# Patient Record
Sex: Male | Born: 1960 | Race: Black or African American | Hispanic: No | Marital: Single | Smoking: Never smoker
Health system: Southern US, Community
[De-identification: ages and names within clinical notes are randomized; demographics above are authoritative.]

## PROBLEM LIST (undated history)

## (undated) DIAGNOSIS — K589 Irritable bowel syndrome without diarrhea: Secondary | ICD-10-CM

## (undated) DIAGNOSIS — F1020 Alcohol dependence, uncomplicated: Secondary | ICD-10-CM

## (undated) DIAGNOSIS — I10 Essential (primary) hypertension: Secondary | ICD-10-CM

## (undated) DIAGNOSIS — E7800 Pure hypercholesterolemia, unspecified: Secondary | ICD-10-CM

## (undated) DIAGNOSIS — M5020 Other cervical disc displacement, unspecified cervical region: Secondary | ICD-10-CM

## (undated) DIAGNOSIS — R1084 Generalized abdominal pain: Secondary | ICD-10-CM

---

## 2011-09-29 ENCOUNTER — Inpatient Hospital Stay (INDEPENDENT_AMBULATORY_CARE_PROVIDER_SITE_OTHER)
Admission: RE | Admit: 2011-09-29 | Discharge: 2011-09-29 | Disposition: A | Discharge status: Home / Self Care | Payer: Self-pay | Source: Ambulatory Visit | Attending: Family Medicine | Admitting: Family Medicine

## 2011-09-29 DIAGNOSIS — T22019A Burn of unspecified degree of unspecified forearm, initial encounter: Secondary | ICD-10-CM

## 2011-09-29 DIAGNOSIS — T2000XA Burn of unspecified degree of head, face, and neck, unspecified site, initial encounter: Secondary | ICD-10-CM

## 2011-09-29 DIAGNOSIS — I10 Essential (primary) hypertension: Secondary | ICD-10-CM

## 2011-09-30 ENCOUNTER — Inpatient Hospital Stay (INDEPENDENT_AMBULATORY_CARE_PROVIDER_SITE_OTHER)
Admission: RE | Admit: 2011-09-30 | Discharge: 2011-09-30 | Disposition: A | Discharge status: Home / Self Care | Payer: Self-pay | Source: Ambulatory Visit | Attending: Family Medicine | Admitting: Family Medicine

## 2011-09-30 DIAGNOSIS — T22019A Burn of unspecified degree of unspecified forearm, initial encounter: Secondary | ICD-10-CM

## 2011-09-30 DIAGNOSIS — I10 Essential (primary) hypertension: Secondary | ICD-10-CM

## 2011-09-30 DIAGNOSIS — T2006XA Burn of unspecified degree of forehead and cheek, initial encounter: Secondary | ICD-10-CM

## 2012-04-14 ENCOUNTER — Emergency Department (HOSPITAL_COMMUNITY)
Admission: EM | Admit: 2012-04-14 | Discharge: 2012-04-14 | Disposition: A | Discharge status: Home / Self Care | Payer: Self-pay | Attending: Emergency Medicine | Admitting: Emergency Medicine

## 2012-04-14 ENCOUNTER — Encounter (HOSPITAL_COMMUNITY): Payer: Self-pay

## 2012-04-14 ENCOUNTER — Emergency Department (HOSPITAL_COMMUNITY): Payer: Self-pay

## 2012-04-14 DIAGNOSIS — S62639B Displaced fracture of distal phalanx of unspecified finger, initial encounter for open fracture: Secondary | ICD-10-CM | POA: Insufficient documentation

## 2012-04-14 DIAGNOSIS — S61258A Open bite of other finger without damage to nail, initial encounter: Secondary | ICD-10-CM

## 2012-04-14 DIAGNOSIS — W540XXA Bitten by dog, initial encounter: Secondary | ICD-10-CM | POA: Insufficient documentation

## 2012-04-14 DIAGNOSIS — Z23 Encounter for immunization: Secondary | ICD-10-CM | POA: Insufficient documentation

## 2012-04-14 HISTORY — DX: Essential (primary) hypertension: I10

## 2012-04-14 MED ORDER — RABIES IMMUNE GLOBULIN 150 UNIT/ML IM INJ
20.0000 [IU]/kg | INJECTION | INTRAMUSCULAR | Status: AC
  Administered 2012-04-14: 1425 [IU] via INTRAMUSCULAR
  Filled 2012-04-14: qty 9.5

## 2012-04-14 MED ORDER — OXYCODONE-ACETAMINOPHEN 5-325 MG PO TABS: 2.0000 | ORAL_TABLET | ORAL | Status: DC | PRN

## 2012-04-14 MED ORDER — AMOXICILLIN-POT CLAVULANATE 875-125 MG PO TABS: 1.0000 | ORAL_TABLET | Freq: Two times a day (BID) | ORAL | Status: AC

## 2012-04-14 MED ORDER — AMOXICILLIN-POT CLAVULANATE 875-125 MG PO TABS: 1.0000 | ORAL_TABLET | Freq: Two times a day (BID) | ORAL | Status: DC

## 2012-04-14 MED ORDER — RABIES VACCINE, PCEC IM SUSR
1.0000 mL | Freq: Once | INTRAMUSCULAR | Status: AC
  Administered 2012-04-14: 1 mL via INTRAMUSCULAR
  Filled 2012-04-14: qty 1

## 2012-04-14 NOTE — Progress Notes (Signed)
Orthopedic Tech Progress Note Patient Details:  Todd Friedman 1961-02-17 [id number]  Type of Splint: Finger Splint Location: left hand Splint Interventions: Application    [name] 04/14/2012, 9:15 PM

## 2012-04-14 NOTE — ED Notes (Signed)
Pt. Pulled off a pit bull attacking a women and the dog bit his ltt. 5th finger tip, bleeding controlled.  Pt. Did not know the dog. The dog took off running.

## 2012-04-14 NOTE — ED Provider Notes (Signed)
History   This chart was scribed for Hurman Horn, MD scribed by Magnus Sinning. The patient was seen in room STRE3/STRE3 seen at 19:55.    CSN: [id number]  Arrival date & time 04/14/12  2956   First MD Initiated Contact with Patient 04/14/12 1949      Chief Complaint  Patient presents with  . Animal Bite    (Consider location/radiation/quality/duration/timing/severity/associated sxs/prior treatment) HPI Todd Friedman is a 51 y.o. male who presents to the Emergency Department complaining of an animal bite to fingertip to left long finger. Pt says he was heading into a store when he noticed an unknown unprovoked pit bull biting at a woman's sweatshirt. Pt says he was bit during an attempt to pull the pit bull off the woman. Pt states he is right handed and denies injury to neck, right hand, abd, or back. As soon as the dog bit the patient's left long fingertip the dog ran away without any known waves identifying or catching the animal at this time. The patient has not spoken with the police yet. He has localized pain to the left long fingertip only with no weakness or numbness and no foreign body sensation. Tetanus: Last within the last five years.  Past Medical History  Diagnosis Date  . Hypertension     History reviewed. No pertinent past surgical history.  No family history on file.  History  Substance Use Topics  . Smoking status: Current Everyday Smoker  . Smokeless tobacco: Not on file  . Alcohol Use: No      Review of Systems  Constitutional: Negative for fever.       10 Systems reviewed and are negative for acute change except as noted in the HPI.  HENT: Negative for congestion.   Eyes: Negative for discharge and redness.  Respiratory: Negative for cough and shortness of breath.   Cardiovascular: Negative for chest pain.  Gastrointestinal: Negative for vomiting and abdominal pain.  Musculoskeletal: Negative for back pain.       Animal bite to fingertip of left  long finger  Skin: Negative for rash.  Neurological: Negative for syncope, numbness and headaches.  Psychiatric/Behavioral:       No behavior change.  All other systems reviewed and are negative.    Allergies  Review of patient's allergies indicates no known allergies.  Home Medications   Current Outpatient Rx  Name Route Sig Dispense Refill  . AMOXICILLIN-POT CLAVULANATE 875-125 MG PO TABS Oral Take 1 tablet by mouth 2 (two) times daily. One po bid x 7 days 14 tablet 0  . OXYCODONE-ACETAMINOPHEN 5-325 MG PO TABS Oral Take 2 tablets by mouth every 4 (four) hours as needed for pain. 20 tablet 0    BP 169/104  Pulse 94  Temp(Src) 98.5 F (36.9 C) (Oral)  Resp 20  SpO2 96%  Physical Exam  Nursing note and vitals reviewed. Constitutional:       Awake, alert, nontoxic appearance.  HENT:  Head: Atraumatic.  Eyes: Right eye exhibits no discharge. Left eye exhibits no discharge.  Neck: Neck supple.  Pulmonary/Chest: Effort normal. He exhibits no tenderness.  Abdominal: Soft. There is no tenderness. There is no rebound.  Musculoskeletal: He exhibits no tenderness.       Baseline ROM, no obvious new focal weakness. No tenderness to right arm or both legs. Left arm has no tenderness to upper arm,forearm,wrist, or hand except for the long finger tip. The left long finger has CR < 2 seconds, normal  light touch, 2 PDI at 5 mm, intact 5/5 FDP, FDS, extension against resistance, distal volar pad with multiple puncture wounds 5 to 10 mm each with local swelling and tenderness, nail intact. No njury  From DIP proximally. No gross foreign body noted or evidence of deep structure involvement noted (tendon/joint/NV)other than tiny avulsion fracture distal phalynx noted on xray.  Neurological:       Mental status and motor strength appears baseline for patient and situation.  Skin: No rash noted.  Psychiatric: He has a normal mood and affect.    ED Course  Procedures (including critical care  time) DIAGNOSTIC STUDIES: Oxygen Saturation is 96% on room air, normal by my interpretation.   After time out taken and sterile preparation a left long finger digital block with 2% lidocaine was performed to allow wound cleansing irrigation, since this is a dog bite with multiple lacerations to the distal volar pad distal to the distal interphalangeal joint I do not think primary wound closure is indicated at this time the patient understands and agrees with this assessment and plan as well COORDINATION OF CARE:  Dg Finger Middle Left  04/14/2012  *RADIOLOGY REPORT*  Clinical Data: Dog bite.  LEFT MIDDLE FINGER 2+V  Comparison: None.  Findings: There is a laceration of the distal aspect of the left long finger.  Chip fracture off of the medial aspect of the tuft is noted.  Soft tissues are swollen.  IMPRESSION: Laceration with a chip fracture off the medial aspect of the tuft of the long finger.  Original Report Authenticated By: Bernadene Bell. D'ALESSIO, M.D.     1. Dog bite of middle finger   2. Open fracture of tuft of distal phalanx of finger       MDM   I personally performed the services described in this documentation, which was scribed in my presence. The recorded information has been reviewed and considered. Pt stable in ED with no significant deterioration in condition.Patient / Family / Caregiver informed of clinical course, understand medical decision-making process, and agree with plan.I doubt any other EMC precluding discharge at this time including, but not necessarily limited to the following:NV compromise, tendon involvement.  Since no known way to find/capture dog, started rabies series.      Hurman Horn, MD 04/15/12 [phone]

## 2012-04-14 NOTE — ED Notes (Addendum)
Patient states he was got out of his car at the store and saw a large dog attacking an older lady and he grab the dog and the dog bit the tip of his left middle finger. Left Middle finger appears to have multiple bite marks from the tip of the finger up to the first joint.  Bleeding controlled at this time.

## 2012-04-14 NOTE — ED Notes (Signed)
Pt given a copy of Rabies Vaccine instructions, it was signed by pt with pt's phone number, and then sent to pharmacy and [facility]

## 2012-04-14 NOTE — Discharge Instructions (Signed)
A laceration is a cut or lesion that goes through all layers of the skin and into the tissue just beneath the skin. This may have been repaired by your caregiver.  SEEK MEDICAL ATTENTION IF: There is redness, swelling, increasing pain in the wound  There is a red line that goes up your arm or leg.  Pus is coming from wound.  You develop an unexplained temperature above 100.4.  You notice a foul smell coming from the wound or dressing.  There is a breaking open of the wound (edges not staying together) after sutures have been removed. If you did not receive a tetanus shot today because you thought you were up to date, but did not recall when your last one was given, make sure to check with your primary caregiver to determine if you need one.  Followup with the police as well per their directions.

## 2012-04-16 ENCOUNTER — Telehealth (HOSPITAL_COMMUNITY): Payer: Self-pay | Admitting: *Deleted

## 2012-04-16 NOTE — ED Notes (Signed)
I called pt. Tt schedule his rabies shot on 5/14.  Left message to call tomorrow after 1130. Vassie Moselle 04/16/2012

## 2012-04-17 ENCOUNTER — Encounter (HOSPITAL_COMMUNITY): Payer: Self-pay | Admitting: *Deleted

## 2012-04-17 ENCOUNTER — Telehealth (HOSPITAL_COMMUNITY): Payer: Self-pay | Admitting: *Deleted

## 2012-04-17 ENCOUNTER — Emergency Department (INDEPENDENT_AMBULATORY_CARE_PROVIDER_SITE_OTHER)
Admission: EM | Admit: 2012-04-17 | Discharge: 2012-04-17 | Disposition: A | Discharge status: Home / Self Care | Payer: Self-pay | Source: Home / Self Care | Attending: Family Medicine | Admitting: Family Medicine

## 2012-04-17 DIAGNOSIS — S62609G Fracture of unspecified phalanx of unspecified finger, subsequent encounter for fracture with delayed healing: Secondary | ICD-10-CM

## 2012-04-17 DIAGNOSIS — S61209A Unspecified open wound of unspecified finger without damage to nail, initial encounter: Secondary | ICD-10-CM

## 2012-04-17 DIAGNOSIS — S61452D Open bite of left hand, subsequent encounter: Secondary | ICD-10-CM

## 2012-04-17 DIAGNOSIS — W540XXA Bitten by dog, initial encounter: Secondary | ICD-10-CM

## 2012-04-17 DIAGNOSIS — S62609A Fracture of unspecified phalanx of unspecified finger, initial encounter for closed fracture: Secondary | ICD-10-CM

## 2012-04-17 DIAGNOSIS — S61239A Puncture wound without foreign body of unspecified finger without damage to nail, initial encounter: Secondary | ICD-10-CM

## 2012-04-17 DIAGNOSIS — Z23 Encounter for immunization: Secondary | ICD-10-CM

## 2012-04-17 HISTORY — DX: Pure hypercholesterolemia, unspecified: E78.00

## 2012-04-17 HISTORY — DX: Generalized abdominal pain: R10.84

## 2012-04-17 MED ORDER — HYDROCODONE-ACETAMINOPHEN 5-325 MG PO TABS
ORAL_TABLET | ORAL | Status: AC
  Filled 2012-04-17: qty 1

## 2012-04-17 MED ORDER — HYDROCODONE-ACETAMINOPHEN 5-325 MG PO TABS
1.0000 | ORAL_TABLET | Freq: Once | ORAL | Status: AC
  Administered 2012-04-17: 1 via ORAL

## 2012-04-17 MED ORDER — RABIES VACCINE, PCEC IM SUSR
INTRAMUSCULAR | Status: AC
  Filled 2012-04-17: qty 1

## 2012-04-17 MED ORDER — RABIES VACCINE, PCEC IM SUSR
1.0000 mL | Freq: Once | INTRAMUSCULAR | Status: AC
  Administered 2012-04-17: 1 mL via INTRAMUSCULAR

## 2012-04-17 NOTE — ED Notes (Signed)
Pt.'s wife called and said he is working and can't come at [employer].  He can come at 1730. I asked if he could come at 1715 to do the paperwork and I would try to get him out by 1800. She called her husband and called me back and said yes he can come at 1715. Vassie Moselle 04/17/2012

## 2012-04-17 NOTE — Discharge Instructions (Signed)
Continue to take antibiotic pill as previously prescribed. You need to followup with a hand specialist tomorrow call the number provided above to make an appointment. If unable to followup at Dr. Eliberto Ivory office tomorrow you need to have your wound rechecked in the emergency department by the hand specialist.  Go to the emergency department if worsening pain, swelling or drainage. Return here for rabies vaccine as scheduled.

## 2012-04-17 NOTE — ED Provider Notes (Signed)
History     CSN: [id number]  Arrival date & time 04/17/12  1714   First MD Initiated Contact with Patient 04/17/12 1718      Chief Complaint  Patient presents with  . Rabies Injection    (Consider location/radiation/quality/duration/timing/severity/associated sxs/prior treatment) HPI Comments: 51 year old male nondiabetic smoker. Right-handed. With a recent history of a dog bite sustained in his left middle finger on 04/14/2012. Here for followup rabies vaccine and wound check.  Patient was diagnosed with puncture wounds and turf fracture of the left third digit distal phalanx. Rabies protocol was initiated in the emergency department with rabies immunoglobulie and first immunization dose. Patient had a prescription for Augmentin and his wounds were left to heal by second intention. Dry dressing was placed and asked to followup here. Reports tenderness. Taking the medication with no side effects. Has not removed dressing. Denies fever.   Past Medical History  Diagnosis Date  . Hypertension   . High cholesterol   . Abdominal cramping, generalized     No past surgical history on file.  Family History  Problem Relation Age of Onset  . Cancer Mother   . Hypertension Mother   . Leukemia Father   . Leukemia Other     History  Substance Use Topics  . Smoking status: Current Everyday Smoker  . Smokeless tobacco: Not on file  . Alcohol Use: Yes      Review of Systems  Constitutional: Negative for fever and chills.  Gastrointestinal: Negative for nausea, vomiting and abdominal pain.  Musculoskeletal:       As per HPI  Skin: Positive for wound.  Neurological: Negative for dizziness, tremors, seizures, weakness, numbness and headaches.  All other systems reviewed and are negative.    Allergies  Review of patient's allergies indicates no known allergies.  Home Medications   Current Outpatient Rx  Name Route Sig Dispense Refill  . AMOXICILLIN-POT CLAVULANATE 875-125 MG  PO TABS Oral Take 1 tablet by mouth 2 (two) times daily. One po bid x 7 days 14 tablet 0  . OXYCODONE-ACETAMINOPHEN 5-325 MG PO TABS Oral Take 2 tablets by mouth every 4 (four) hours as needed for pain. 20 tablet 0  . OXYCODONE-ACETAMINOPHEN 5-325 MG PO TABS Oral Take 1-2 tablets by mouth every 6 (six) hours as needed for pain. 8 tablet 0    BP 158/92  Pulse 85  Temp(Src) 98.8 F (37.1 C) (Oral)  Resp 20  SpO2 100%  Physical Exam  Nursing note and vitals reviewed. Constitutional: He is oriented to person, place, and time. He appears well-developed and well-nourished. No distress.  Eyes: EOM are normal. Pupils are equal, round, and reactive to light. No scleral icterus.  Cardiovascular: Normal heart sounds.   Pulmonary/Chest: Breath sounds normal.  Musculoskeletal:       Left middle finger: 2 puncture wounds in the digital pad of the distal phalanx healing by second intention. One puncture wound in dorsolateral area of distal phalange. Distal phalange looks deformed and mildly swollen. Some associated erythema and small hematoma at the tip. No obvious necrosis.  Nail and nail bed appears intact. Pink granulation tissue growing outside of the wounds. Patient able to flex and extend distal phalanx with mild discomfort. Area is tender to palpation also reported tender to palpation of the palmar side of medial phalanx no significant swelling or redness . No tenderness swelling or redness of the proximal phalanx.  Appears intact sensation in dorsal and palmar side of the entire left middle finger.  Radial and ulnar pulses normal.   Neurological: He is alert and oriented to person, place, and time.  Skin:       As per MS exam.    ED Course  Procedures (including critical care time)  Labs Reviewed - No data to display No results found.   1. Puncture wound of finger of left hand with complication   2. Fracture phalanges, hand, with delayed healing, subsequent encounter   3. Dog bite,  hand, left, subsequent encounter       MDM  No significant redness and no drainage, fair range of motion, appears augmenting is providing appropriate coverage. Nevertheless open turf fracture with distal phalange deformity and tenderness in medial phalange is concerning and at high risk for infection, in my impression should be evaluated by hand specialist. Case discussed with Dr. Lajoyce Corners (hand on call for [facility], this group was on call on day of this patient injury, although there were no consults) Dr. Lajoyce Corners recomended to follow up tomorrow with Dr. Magnus Ivan at their office. Patient instructed to call Dr. Vevelyn Royals office for follow up tomorrow or otherwise to go to [facility] ED for hand specialist to see him in the ED. Dressing changed. Continue Augmentin.        Sharin Grave, MD 04/19/12 [phone]

## 2012-04-17 NOTE — ED Notes (Signed)
Pt. Here for second rabies vaccine for dog bite to L long finger.  States finger still throbbing. Has splint and dressing on. Does not know when he is supposed to take the dressing off. Cherly Anderson M

## 2012-04-18 ENCOUNTER — Encounter (HOSPITAL_COMMUNITY): Payer: Self-pay | Admitting: Emergency Medicine

## 2012-04-18 ENCOUNTER — Emergency Department (HOSPITAL_COMMUNITY)
Admission: EM | Admit: 2012-04-18 | Discharge: 2012-04-18 | Disposition: A | Discharge status: Home / Self Care | Payer: Self-pay | Attending: Emergency Medicine | Admitting: Emergency Medicine

## 2012-04-18 DIAGNOSIS — R609 Edema, unspecified: Secondary | ICD-10-CM | POA: Insufficient documentation

## 2012-04-18 DIAGNOSIS — Z4800 Encounter for change or removal of nonsurgical wound dressing: Secondary | ICD-10-CM | POA: Insufficient documentation

## 2012-04-18 DIAGNOSIS — I10 Essential (primary) hypertension: Secondary | ICD-10-CM | POA: Insufficient documentation

## 2012-04-18 DIAGNOSIS — Z5189 Encounter for other specified aftercare: Secondary | ICD-10-CM

## 2012-04-18 DIAGNOSIS — F17200 Nicotine dependence, unspecified, uncomplicated: Secondary | ICD-10-CM | POA: Insufficient documentation

## 2012-04-18 DIAGNOSIS — E7800 Pure hypercholesterolemia, unspecified: Secondary | ICD-10-CM | POA: Insufficient documentation

## 2012-04-18 MED ORDER — OXYCODONE-ACETAMINOPHEN 5-325 MG PO TABS: 1.0000 | ORAL_TABLET | Freq: Four times a day (QID) | ORAL | Status: DC | PRN

## 2012-04-18 MED ORDER — OXYCODONE-ACETAMINOPHEN 5-325 MG PO TABS
2.0000 | ORAL_TABLET | Freq: Once | ORAL | Status: AC
  Administered 2012-04-18: 2 via ORAL
  Filled 2012-04-18: qty 2

## 2012-04-18 NOTE — ED Provider Notes (Signed)
History     CSN: [id number]  Arrival date & time 04/18/12  5621   First MD Initiated Contact with Patient 04/18/12 2105      Chief Complaint  Patient presents with  . Wound Check    (Consider location/radiation/quality/duration/timing/severity/associated sxs/prior treatment) HPI Comments: Patient with animal bite from pit bull to L middle finger sustained 4 days ago. Patient received wound care, was started on augmentin, and received rabies vaccine and IgG at that time. Decision made to allow wound to heal by secondary intention at that time due to infection risk. Patient had wound recheck and day 3 rabies vaccination yesterday at [facility]. Wound was healing appropriately per note at that time. Patient has been compliant with augmentin. Today the patient presents for wound check. He states the finger is less painful than yesterday and is improving. He has not seen any drainage. No worsening erythema or streaking. His concern today is that he was told to follow-up with orthopedic hand surgery, but when he called office he was told he needs to pay and he states he cannot afford this. He states he was told to go to ED for evaluation. He is not here with concerns over wound healing. Nothing makes symptoms better or worse. Pain is throbbing -- controlled at home with percocet.   Patient is a 51 y.o. male presenting with animal bite. The history is provided by the patient.  Animal Bite  The incident occurred more than 2 days ago. Pertinent negatives include no numbness, no nausea, no vomiting and no weakness.    Past Medical History  Diagnosis Date  . Hypertension   . High cholesterol   . Abdominal cramping, generalized     History reviewed. No pertinent past surgical history.  Family History  Problem Relation Age of Onset  . Cancer Mother   . Hypertension Mother   . Leukemia Father   . Leukemia Other     History  Substance Use Topics  . Smoking status: Current Everyday Smoker  .  Smokeless tobacco: Not on file  . Alcohol Use: Yes      Review of Systems  Constitutional: Negative for fever and activity change.  Gastrointestinal: Negative for nausea and vomiting.  Musculoskeletal: Positive for arthralgias. Negative for joint swelling.  Skin: Positive for wound. Negative for color change.       Positive for abscess  Neurological: Negative for weakness and numbness.    Allergies  Review of patient's allergies indicates no known allergies.  Home Medications   Current Outpatient Rx  Name Route Sig Dispense Refill  . AMOXICILLIN-POT CLAVULANATE 875-125 MG PO TABS Oral Take 1 tablet by mouth 2 (two) times daily. One po bid x 7 days 14 tablet 0  . OXYCODONE-ACETAMINOPHEN 5-325 MG PO TABS Oral Take 2 tablets by mouth every 4 (four) hours as needed for pain. 20 tablet 0    BP 140/91  Pulse 94  Temp(Src) 98.4 F (36.9 C) (Oral)  SpO2 97%  Physical Exam  Nursing note and vitals reviewed. Constitutional: He appears well-developed and well-nourished.  HENT:  Head: Normocephalic and atraumatic.  Eyes: Conjunctivae are normal.  Neck: Normal range of motion. Neck supple.  Cardiovascular: Normal pulses.   Musculoskeletal: He exhibits edema and tenderness.       There are healing, open, lacerations noted distal to DIP with some edema of soft tissues. There is no drainage and surrounding skin is pink. There is granulation tissue noted within wound. Wound appears appropriate for 4 days  post-bite, healing by secondary intention. Patient is able to flex and extend at MCP, PIP, and DIP. There are no signs of flexor tenosynovitis.   Neurological: He is alert. No sensory deficit.       Motor, sensation, and vascular distal to the injury is fully intact. Cap refill < 2s.   Skin: Skin is warm and dry. No erythema.  Psychiatric: He has a normal mood and affect.    ED Course  Procedures (including critical care time)  Labs Reviewed - No data to display No results  found.   1. Visit for wound check     9:34 PM Patient seen and examined. Soaking finger to help loosen bandages. Will order pain medicine.   Vital signs reviewed and are as follows: Filed Vitals:   04/18/12 1836  BP: 140/91  Pulse: 94  Temp: 98.4 F (36.9 C)   The patient was urged to return to the Emergency Department urgently with worsening pain, swelling, expanding erythema especially if it streaks away from the affected area, fever, or if they have any other concerns. Patient verbalized understanding.   Urged to keep [facility] follow-up for day 7 rabies vaccination and wound recheck.   Patient counseled on use of narcotic pain medications. Counseled not to combine these medications with others containing tylenol. Urged not to drink alcohol, drive, or perform any other activities that requires focus while taking these medications. The patient verbalizes understanding and agrees with the plan.   MDM  Wound: healing well by secondary intention, no wound infection suspected, clinically improving per patient, no signs of flexor tendon injury and no signs of flexor tenosynovitis.   Hand follow-up: SW has seen patient and patient states they are trying to help him find resources for follow-up. There are no indications for immediate or emergent hand consultation tonight.         Renne Crigler, [place] 04/19/12 1747

## 2012-04-18 NOTE — ED Notes (Signed)
Dressing in place and not removed.

## 2012-04-18 NOTE — ED Notes (Signed)
Pt st's he was bitten by a dog on Sat.  Went to Urgent Care yesterday for recheck and rabies shot was told to follow up with ortho doctor.  Pt called for appt. But was told he had to have $200.00 to be seen.  Pt st's he does not have the funds. Dressing not removed in triage.  Social Worker paged.

## 2012-04-18 NOTE — Discharge Instructions (Signed)
Please read and follow all provided instructions.  Your diagnoses today include:  1. Visit for wound check     Tests performed today include:  Vital signs. See below for your results today.   Medications prescribed:   Percocet (oxycodone/acetaminophen) - narcotic pain medicine  You have been prescribed narcotic pain medication such as Vicodin or Percocet: DO NOT drive or perform any activities that require you to be awake and alert because this medicine can make you drowsy. BE VERY CAREFUL not to take multiple medicines containing Tylenol (also called acetaminophen). Doing so can lead to an overdose which can damage your liver and cause liver failure and possibly death.    Take any prescribed medications only as directed.   Home care instructions:  Follow any educational materials contained in this packet. Keep affected area above the level of your heart when possible. Wash area gently twice a day with warm soapy water. Do not apply alcohol or hydrogen peroxide. Cover the area if it draining or weeping.   Follow-up instructions: Follow-up with Urgent Care this Saturday as planned for wound recheck and next rabies vaccination.   Please follow-up with your primary care provider in the next 1 week for further evaluation of your symptoms. If you do not have a primary care doctor -- see below for referral information.   Return instructions:  Return to the Emergency Department if you have:  Fever  Worsening symptoms  Worsening pain  Worsening swelling  Redness of the skin that moves away from the affected area, especially if it streaks away from the affected area   Any other emergent concerns  Your vital signs today were: BP 140/91  Pulse 94  Temp(Src) 98.4 F (36.9 C) (Oral)  SpO2 97% If your blood pressure (BP) was elevated above 135/85 this visit, please have this repeated by your doctor within one month. -------------- No Primary Care Doctor Call [facility]   [phone] Other agencies that provide inexpensive medical care    [facility]  [phone]    [facility]  [phone]    [facility]  [phone]    [facility]  [phone]    [facility]  [phone]    [facility]  [phone] -------------- RESOURCE GUIDE:  Dental Problems  Patients with Medicaid: [facility] [phone] [address]                                            [phone] Phone:  [phone]                                                   Phone:  [phone]  If unable to pay or uninsured, contact:  [facility] or [facility]. to become qualified for the adult dental clinic.  Chronic Pain Problems Contact [facility]  [phone] Patients need to be referred by their primary care doctor.  Insufficient Money for Medicine Contact [employer]:  call "211" or [facility] 386-299-6115.  Psychological Services [employer] [facility]  [phone] [facility]  Services  [phone] [facility]   [phone] (emergency services [phone])  Substance Abuse Resources Alcohol and Drug Services  564-719-2134 [facility] [phone] [facility] 302-051-1762 Floydene Flock [phone] Residential & Outpatient Substance Abuse Program  ([phone]  Abuse/Neglect [facility] Child Abuse Hotline 279-073-8362 [facility] Child Abuse Hotline [phone] (After Hours)  [facility] [phone]  [facility] [place] [facility] [phone] [facility] 9516453325  [facility] Resources  [facility]     [employer]                          [facility] Dept. [address] [place]                       [address]      [address]  Blondell Reveal Phone:  [phone]                                   Phone:  [phone]                 Phone:  [phone]  [facility] Phone:  [phone]  [facility] Child Abuse Hotline [phone] 6091565158 (After Hours)

## 2012-04-21 ENCOUNTER — Encounter (HOSPITAL_COMMUNITY): Payer: Self-pay | Admitting: Emergency Medicine

## 2012-04-21 ENCOUNTER — Emergency Department (INDEPENDENT_AMBULATORY_CARE_PROVIDER_SITE_OTHER)
Admission: EM | Admit: 2012-04-21 | Discharge: 2012-04-21 | Disposition: A | Discharge status: Home / Self Care | Payer: Self-pay | Source: Home / Self Care | Attending: Emergency Medicine | Admitting: Emergency Medicine

## 2012-04-21 DIAGNOSIS — S61452D Open bite of left hand, subsequent encounter: Secondary | ICD-10-CM

## 2012-04-21 DIAGNOSIS — S60229A Contusion of unspecified hand, initial encounter: Secondary | ICD-10-CM

## 2012-04-21 DIAGNOSIS — W540XXA Bitten by dog, initial encounter: Secondary | ICD-10-CM

## 2012-04-21 MED ORDER — RABIES VACCINE, PCEC IM SUSR
1.0000 mL | Freq: Once | INTRAMUSCULAR | Status: AC
Start: 2012-04-21 — End: 2012-04-21
  Administered 2012-04-21: 1 mL via INTRAMUSCULAR

## 2012-04-21 MED ORDER — IBUPROFEN 600 MG PO TABS: 600.0000 mg | ORAL_TABLET | Freq: Four times a day (QID) | ORAL | Status: AC | PRN

## 2012-04-21 MED ORDER — HYDROCODONE-ACETAMINOPHEN 5-325 MG PO TABS: 2.0000 | ORAL_TABLET | Freq: Four times a day (QID) | ORAL | Status: AC | PRN

## 2012-04-21 MED ORDER — RABIES VACCINE, PCEC IM SUSR
INTRAMUSCULAR | Status: AC
  Filled 2012-04-21: qty 1

## 2012-04-21 NOTE — ED Notes (Signed)
Patient reports he was told to go to the emergency department on Thursday-to have hand looked at.  At that visit was told to have his wound looked at when he came for rabies injection.  Patient is here for the 3rd injection in series.

## 2012-04-21 NOTE — ED Provider Notes (Signed)
History     CSN: [id number]  Arrival date & time 04/21/12  1801   First MD Initiated Contact with Patient 04/21/12 1824      Chief Complaint  Patient presents with  . Wound Check    (Consider location/radiation/quality/duration/timing/severity/associated sxs/prior treatment) HPI Comments: Patient is a right-handed male who was bitten on the left distal middle finger by a pit bull on 5/11. He sustained 2 deep lacerations, and a tuft fracture of distal phalanx. Due to the complex nature of the wound, and high risk for infection, it was decided to let it heal by secondary intention. Rabies protocol was initiated, and He was started on Augmentin, which he states that he is taking as directed. He presented to the urgent care 5/14 for a rabies shot, dressing change, and was instructed to followup with Dr. Magnus Ivan at [facility] orthopedics. Patient states that he was unable to afford to see a hand specialist, so he returned to the ER on 5/16. Per notes, patient was doing well and the wound was clinically improving. He was evaluated by social work to try and help him find resources for followup. Patient states that he was indeed evaluated by Child psychotherapist, but that they never returned with a plan.  Patient is here today for a third injection of rabies series, wound recheck.  States the pain has improved but still present. Describes throbbing but is controlled with Percocet. Hasn't tried anything else for his pain. No aggravating factors. No fevers, redness, edema of hand, purulent drainage coming through the dressing. He has not removed the dressing since it was placed 2 days ago. Patient is a smoker. Not a diabetic. Tetanus up-to-date.  Patient is a 51 y.o. male presenting with wound check. The history is provided by the patient. No language interpreter was used.  Wound Check  Previous treatment in the ED includes wound cleansing or irrigation and oral antibiotics. Treatments since wound repair include  oral antibiotics and a wound recheck. The redness has improved. The swelling has improved. The pain has improved. There is difficulty moving the extremity or digit due to pain.    Past Medical History  Diagnosis Date  . Hypertension   . High cholesterol   . Abdominal cramping, generalized     History reviewed. No pertinent past surgical history.  Family History  Problem Relation Age of Onset  . Cancer Mother   . Hypertension Mother   . Leukemia Father   . Leukemia Other     History  Substance Use Topics  . Smoking status: Current Everyday Smoker  . Smokeless tobacco: Not on file  . Alcohol Use: Yes      Review of Systems  Constitutional: Negative for fever.  Gastrointestinal: Negative for nausea and vomiting.  Skin: Positive for wound. Negative for color change.  Neurological: Negative for weakness and numbness.    Allergies  Review of patient's allergies indicates no known allergies.  Home Medications   Current Outpatient Rx  Name Route Sig Dispense Refill  . AMOXICILLIN-POT CLAVULANATE 875-125 MG PO TABS Oral Take 1 tablet by mouth 2 (two) times daily. One po bid x 7 days 14 tablet 0  . HYDROCODONE-ACETAMINOPHEN 5-325 MG PO TABS Oral Take 2 tablets by mouth every 6 (six) hours as needed for pain. 10 tablet 0  . IBUPROFEN 600 MG PO TABS Oral Take 1 tablet (600 mg total) by mouth every 6 (six) hours as needed for pain. 20 tablet 0    BP 165/95  Pulse  81  Temp(Src) 98.6 F (37 C) (Oral)  Resp 16  SpO2 98%  Physical Exam  Nursing note and vitals reviewed. Constitutional: He is oriented to person, place, and time. He appears well-developed and well-nourished.  HENT:  Head: Normocephalic and atraumatic.  Eyes: Conjunctivae and EOM are normal.  Neck: Normal range of motion.  Cardiovascular: Normal rate.   Pulmonary/Chest: Effort normal. No respiratory distress.  Abdominal: He exhibits no distension.  Musculoskeletal: Normal range of motion.       2  healing, deep lacerations to the distal aspect of his left middle finger, with pink granulation tissue and surrounding edema. Localized tenderness, and tenderness down the middle phalanx. No pain down the middle phalanx with actively flexing/extending the finger. Patient able to flex, extend at MCP, PIP, DIP. Finger with intact motor strength FDP / FDS against resistance and 2-point discrimination intact. Refill less than 2 seconds.  Neurological: He is alert and oriented to person, place, and time.  Skin: Skin is warm and dry.  Psychiatric: He has a normal mood and affect. His behavior is normal.    ED Course  Procedures (including critical care time)  Labs Reviewed - No data to display No results found.   1. Dog bite, hand, left, subsequent encounter     MDM  Wound appears to be healing well. Has good granulation tissue, and patient states that it is significantly improving. No signs of infection, flexor tenosynovitis at this time. Had The wound cleansed, and redressed with a sterile dressing. Sending home on NSAIDs, and short course of Percocet.  He'll return for his last rabies shot next Saturday.   Unable to find any record of social work consult. Discussed case with social worker on call, who was unable to access the information she required, and was referred to case management.   Discussed the case with Doristine Section, from case management. We will arrange for him to have this managed at the wound care center, since this is going to take a long time to heal, and the patient has minimal resources. She will start this process on Monday, 5/20. She took the patient's name, medical record number: Date of birth, and phone number she'll call patient on Monday afternoon or Tuesday morning after she has talked  with the wound care center.  Discussed importance of smoking cessation, and that continued smoking will significantly impair his ability to heal. Patient agrees with plan.  Luiz Blare, MD 04/21/12 2037

## 2012-04-21 NOTE — ED Notes (Signed)
Dr Chaney Malling spoke to social worker

## 2012-04-21 NOTE — Discharge Instructions (Signed)
Please return on 04/28/12 at 1:30 pm for final rabies injection.   Take the medication as written. Take 1 gram of tylenol with the motrin up to 4 times a day as needed for pain and fever. This is an effective combination for pain. Take the hydrocodone/norco only for severe pain. Do not take the tylenol and hydrocodone/norco as they both have tylenol in them and too much can hurt your liver. Do not exceed 4 grams of tylenol a day from all sources. Return if you get worse, have a  fever >100.4, or for any concerns.   Go to www.goodrx.com to look up your medications. This will give you a list of where you can find your prescriptions at the most affordable prices.

## 2012-04-21 NOTE — ED Notes (Signed)
Paged Child psychotherapist for dr [employer].  Operator reports lauren mullis [phone].

## 2012-04-21 NOTE — ED Notes (Signed)
Per advise of Social Worker Donn Pierini from Care Management called.  She stated to refer patient to wound center and she will call Monday and facilitate patient placement.  Patient and MD made aware

## 2012-04-21 NOTE — ED Provider Notes (Signed)
Medical screening examination/treatment/procedure(s) were performed by non-physician practitioner and as supervising physician I was immediately available for consultation/collaboration.  Hurman Horn, MD 04/21/12 [phone]

## 2012-04-24 NOTE — ED Notes (Signed)
5/20 Note from [facility] with referral form on my desk showing she had faxed the order for the Wound Care Center and confirmation received on 5/18.  5/21 1630 I called the Wound Care Center and spoke with Onalee Hua. He said he did get the referral but has not scheduled the patient yet. He work on it shortly.  He said next time tell pt. to call for their appointment. They can self refer themselves and get an appointment quicker. Vassie Moselle 04/24/2012

## 2012-04-25 ENCOUNTER — Telehealth (HOSPITAL_COMMUNITY): Payer: Self-pay | Admitting: *Deleted

## 2012-04-25 NOTE — ED Notes (Signed)
Pt. called on VM and said the Wound Care Center has not called him yet. I called him back and left a message, telling him I had called the Wound Care Center yesterday and asked Onalee Hua if they had scheduled his appt. yet. Onalee Hua told me he was going to be calling him soon.  I asked pt. to call me back to let me know if he got the message. Pt. called back and said he got the missed call but did not listen to the message. I told him what I had said. He said he was at work today but his wife said, they did not call his house today. I told pt. I have sent his paperwork.  I gave pt. the number to call in the morning to schedule his appt. I gave pt. my number and asked him to call me back and let me know when the appt. is scheduled. Cherly Anderson M t.d

## 2012-04-28 ENCOUNTER — Emergency Department (HOSPITAL_COMMUNITY)
Admission: EM | Admit: 2012-04-28 | Discharge: 2012-04-28 | Disposition: A | Discharge status: Home / Self Care | Payer: Self-pay | Source: Home / Self Care

## 2012-04-28 MED ORDER — RABIES VACCINE, PCEC IM SUSR
1.0000 mL | Freq: Once | INTRAMUSCULAR | Status: AC
Start: 2012-04-28 — End: 2012-04-28
  Administered 2012-04-28: 1 mL via INTRAMUSCULAR

## 2012-04-28 MED ORDER — RABIES VACCINE, PCEC IM SUSR
INTRAMUSCULAR | Status: AC
  Filled 2012-04-28: qty 1

## 2012-04-28 NOTE — Discharge Instructions (Signed)
This is your final rabies injection.  Please continue care with the wound center as needed

## 2012-04-28 NOTE — ED Notes (Signed)
Patient here for final rabies injection. 

## 2012-05-01 ENCOUNTER — Encounter (HOSPITAL_BASED_OUTPATIENT_CLINIC_OR_DEPARTMENT_OTHER): Payer: Self-pay | Attending: General Surgery

## 2012-05-01 DIAGNOSIS — I10 Essential (primary) hypertension: Secondary | ICD-10-CM | POA: Insufficient documentation

## 2012-05-01 DIAGNOSIS — S61209A Unspecified open wound of unspecified finger without damage to nail, initial encounter: Secondary | ICD-10-CM | POA: Insufficient documentation

## 2012-05-01 DIAGNOSIS — W540XXA Bitten by dog, initial encounter: Secondary | ICD-10-CM | POA: Insufficient documentation

## 2012-05-01 NOTE — H&P (Signed)
NAME:  Todd Friedman, Todd Friedman NO.:  [id number]  MEDICAL RECORD NO.:  [id number]  LOCATION:  FOOT                         FACILITY:  [facility]  PHYSICIAN:  Joanne Gavel, M.D.        DATE OF BIRTH:  August 07, 1961  DATE OF ADMISSION:  05/01/2012 DATE OF DISCHARGE:                             HISTORY & PHYSICAL   ADMISSION DIAGNOSIS:  Dog bite, left second finger.  HISTORY OF PRESENT ILLNESS:  This patient has a serious dog bite approximately 2 weeks ago.  He was seen in the emergency room.  He was treated with antibiotics, intramuscular and oral, and was started on a course of rabies shots.  His wound has failed to heal and his finger is quite tender.  He has not had fevers, chills, or sweating spells.  PAST MEDICAL HISTORY:  Essentially negative.  He has had hypertension, hypercholesterol, and inflammatory bowel disease.  PAST SURGICAL HISTORY:  Negative.  SOCIAL HISTORY:  Cigarettes, he does smoke a pack a day.  ALLERGIES:  None.  MEDICATIONS:  Ibuprofen at night.  No other medications.  REVIEW OF SYSTEMS:  Essentially as above.  PHYSICAL EXAMINATION:  VITAL SIGNS: Temperature is 98.3, pulse 92, respirations 18, blood pressure 145/92. GENERAL APPEARANCE: Well developed, well nourished, no distress. CRANIUM:  Normocephalic. EYES, EARS, NOSE, THROAT: Normal. CHEST: Clear. HEART: Regular rhythm. EXTREMITIES: Examination of finger reveals it to be quite swollen and tender with 2 wounds at the tip.  X-ray of tip did reveal a fracture of the tuft.  There is some exuberant granulation tissue in the wound which is cauterized.  He is advised to keep his finger up as much as possible.  To continue his present course of treatment.  IMPRESSION:  Dog bite.  We will see him in 7 days.     Joanne Gavel, M.D.     RA/MEDQ  D:  05/01/2012  T:  05/01/2012  Job:  [id number]

## 2012-05-08 ENCOUNTER — Encounter (HOSPITAL_BASED_OUTPATIENT_CLINIC_OR_DEPARTMENT_OTHER): Payer: Self-pay | Attending: General Surgery

## 2012-05-08 DIAGNOSIS — S61209A Unspecified open wound of unspecified finger without damage to nail, initial encounter: Secondary | ICD-10-CM | POA: Insufficient documentation

## 2012-05-08 DIAGNOSIS — W540XXA Bitten by dog, initial encounter: Secondary | ICD-10-CM | POA: Insufficient documentation

## 2012-05-22 ENCOUNTER — Emergency Department (HOSPITAL_COMMUNITY)
Admission: EM | Admit: 2012-05-22 | Discharge: 2012-05-22 | Disposition: A | Discharge status: Home / Self Care | Payer: Self-pay | Attending: Emergency Medicine | Admitting: Emergency Medicine

## 2012-05-22 ENCOUNTER — Encounter (HOSPITAL_COMMUNITY): Payer: Self-pay | Admitting: Emergency Medicine

## 2012-05-22 ENCOUNTER — Emergency Department (HOSPITAL_COMMUNITY): Payer: Self-pay

## 2012-05-22 DIAGNOSIS — M79609 Pain in unspecified limb: Secondary | ICD-10-CM | POA: Insufficient documentation

## 2012-05-22 DIAGNOSIS — I10 Essential (primary) hypertension: Secondary | ICD-10-CM | POA: Insufficient documentation

## 2012-05-22 DIAGNOSIS — E7800 Pure hypercholesterolemia, unspecified: Secondary | ICD-10-CM | POA: Insufficient documentation

## 2012-05-22 DIAGNOSIS — F17200 Nicotine dependence, unspecified, uncomplicated: Secondary | ICD-10-CM | POA: Insufficient documentation

## 2012-05-22 DIAGNOSIS — S61259A Open bite of unspecified finger without damage to nail, initial encounter: Secondary | ICD-10-CM

## 2012-05-22 DIAGNOSIS — Z5189 Encounter for other specified aftercare: Secondary | ICD-10-CM | POA: Insufficient documentation

## 2012-05-22 LAB — BASIC METABOLIC PANEL WITH GFR
BUN: 11 mg/dL (ref 6–23)
CO2: 25 meq/L (ref 19–32)
Calcium: 9.4 mg/dL (ref 8.4–10.5)
Creatinine, Ser: 0.93 mg/dL (ref 0.50–1.35)
GFR calc non Af Amer: >90 mL/min (ref >90)
Glucose, Bld: 87 mg/dL (ref 70–99)
Sodium: 137 meq/L (ref 135–145)

## 2012-05-22 LAB — CBC
HCT: 40 % (ref 39.0–52.0)
Hemoglobin: 13.9 g/dL (ref 13.0–17.0)
MCH: 30.8 pg (ref 26.0–34.0)
MCHC: 34.8 g/dL (ref 30.0–36.0)
MCV: 88.7 fL (ref 78.0–100.0)
Platelets: 220 K/uL (ref 150–400)
RBC: 4.51 MIL/uL (ref 4.22–5.81)
RDW: 14.4 % (ref 11.5–15.5)
WBC: 9.8 K/uL (ref 4.0–10.5)

## 2012-05-22 LAB — DIFFERENTIAL
Basophils Absolute: 0.1 10*3/uL (ref 0.0–0.1)
Basophils Relative: 1 % (ref 0–1)
Eosinophils Absolute: 0.1 K/uL (ref 0.0–0.7)
Eosinophils Relative: 1 % (ref 0–5)
Lymphocytes Relative: 34 % (ref 12–46)
Lymphs Abs: 3.4 K/uL (ref 0.7–4.0)
Monocytes Absolute: 0.7 K/uL (ref 0.1–1.0)
Monocytes Relative: 7 % (ref 3–12)
Neutro Abs: 5.5 10*3/uL (ref 1.7–7.7)
Neutrophils Relative %: 57 % (ref 43–77)

## 2012-05-22 LAB — BASIC METABOLIC PANEL
Chloride: 103 mEq/L (ref 96–112)
GFR calc Af Amer: >90 mL/min (ref >90)
Potassium: 4 mEq/L (ref 3.5–5.1)

## 2012-05-22 LAB — SEDIMENTATION RATE: Sed Rate: 5 mm/hr (ref 0–16)

## 2012-05-22 NOTE — Discharge Summary (Signed)
  See Consult note Dx SP animal bite ti left middle finger SP I&D today Skin debridement Dominica Severin MD

## 2012-05-22 NOTE — ED Notes (Signed)
Dr Amanda Pea at bedside evaluating patient

## 2012-05-22 NOTE — Discharge Instructions (Signed)
Animal Bite  An animal bite can result in a scratch on the skin, deep open cut, puncture of the skin, crush injury, or tearing away of the skin or a body part. Dogs are responsible for most animal bites. Children are bitten more often than adults. An animal bite can range from very mild to more serious. A small bite from your house pet is no cause for alarm. However, some animal bites can become infected or injure a bone or other tissue. You must seek medical care if:  · The skin is broken and bleeding does not slow down or stop after 15 minutes.  · The puncture is deep and difficult to clean (such as a cat bite).  · Pain, warmth, redness, or pus develops around the wound.  · The bite is from a stray animal or rodent. There may be a risk of rabies infection.  · The bite is from a snake, raccoon, skunk, fox, coyote, or bat. There may be a risk of rabies infection.  · The person bitten has a chronic illness such as diabetes, liver disease, or cancer, or the person takes medicine that lowers the immune system.  · There is concern about the location and severity of the bite.  It is important to clean and protect an animal bite wound right away to prevent infection. Follow these steps:  · Clean the wound with plenty of water and soap.  · Apply an antibiotic cream.  · Apply gentle pressure over the wound with a clean towel or gauze to slow or stop bleeding.  · Elevate the affected area above the heart to help stop any bleeding.  · Seek medical care. Getting medical care within 8 hours of the animal bite leads to the best possible outcome.  DIAGNOSIS   Your caregiver will most likely:  · Take a detailed history of the animal and the bite injury.  · Perform a wound exam.  · Take your medical history.  Blood tests or X-rays may be performed. Sometimes, infected bite wounds are cultured and sent to a lab to identify the infectious bacteria.   TREATMENT   Medical treatment will depend on the location and type of animal bite as  well as the patient's medical history. Treatment may include:  · Wound care, such as cleaning and flushing the wound with saline solution, bandaging, and elevating the affected area.  · Antibiotics.  · Tetanus immunization.  · Rabies immunization.  · Leaving the wound open to heal. This is often done with animal bites, due to the high risk of infection. However, in certain cases, wound closure with stitches, wound adhesive, skin adhesive strips, or staples may be used.   Infected bites that are left untreated may require intravenous (IV) antibiotics and surgical treatment in the hospital.  HOME CARE INSTRUCTIONS  · Follow your caregiver's instructions for wound care.  · Take all medicines as directed.  · If your caregiver prescribes antibiotics, take them as directed. Finish them even if you start to feel better.  · Follow up with your caregiver for further exams or immunizations as directed.  You may need a tetanus shot if:  · You cannot remember when you had your last tetanus shot.  · You have never had a tetanus shot.  · The injury broke your skin.  If you get a tetanus shot, your arm may swell, get red, and feel warm to the touch. This is common and not a problem. If you need a tetanus   shot and you choose not to have one, there is a rare chance of getting tetanus. Sickness from tetanus can be serious.  SEEK MEDICAL CARE IF:  · You notice warmth, redness, soreness, swelling, pus discharge, or a bad smell coming from the wound.  · You have a red line on the skin coming from the wound.  · You have a fever, chills, or a general ill feeling.  · You have nausea or vomiting.  · You have continued or worsening pain.  · You have trouble moving the injured part.  · You have other questions or concerns.  MAKE SURE YOU:  · Understand these instructions.  · Will watch your condition.  · Will get help right away if you are not doing well or get worse.  Document Released: 08/09/2011 Document Revised: 11/10/2011 Document  Reviewed: 08/09/2011  Exit[employer]® Patient Information ©2012 [employer].

## 2012-05-22 NOTE — ED Notes (Signed)
Pt sent here by wound center to have Dr Amanda Pea look at dog bite wound on third digit on left hand; pt sts improvement in wound but sts sent here for eval; dressing in place at present

## 2012-05-22 NOTE — ED Provider Notes (Signed)
History   This chart was scribed for Todd Friedman. Todd Lamas, MD by Todd Friedman. The patient was seen in room TR07C/TR07C and the patient's care was started at 4:10 PM     CSN: [id number]  Arrival date & time 05/22/12  1456   None     Chief Complaint  Patient presents with  . Wound Check    (Consider location/radiation/quality/duration/timing/severity/associated sxs/prior treatment) Patient is a 51 y.o. male presenting with wound check and skin laceration. The history is provided by the patient. No language interpreter was used.  Wound Check  He was treated in the ED 10 to 14 days ago. Previous treatment in the ED includes wound cleansing or irrigation, oral antibiotics and burn dressing. Treatments since wound repair include oral antibiotics and a wound recheck. His temperature was unmeasured prior to arrival. There has been no drainage from the wound. The redness has not changed. The swelling has not changed. The pain has not changed. There is difficulty moving the extremity or digit due to pain.  Laceration  The incident occurred more than 1 week ago. The laceration is located on the left hand. Injury mechanism: Dog bite. The pain is moderate. The pain has been constant since onset. He reports no foreign bodies present. His tetanus status is unknown.    Todd Friedman is a 51 y.o. male who presents to the Emergency Department complaining of moderate, episodic wound check located on the left finger onset several weeks ago with associated symptoms of discoloration. The pt states he was bit by a pit bull several weeks ago. The pt describes the pain as a throbbing pain. Modifying factors include application of bandage which provides moderate relief. Pt has a hx of hypertension, high cholesterol. He did bang his finger in bathroom a few days ago and attribtues increase in pain to that when he was further examined by wound center physician.    Pt has been visiting the wound center at [facility].    Past Medical History  Diagnosis Date  . Hypertension   . High cholesterol   . Abdominal cramping, generalized       Family History  Problem Relation Age of Onset  . Cancer Mother   . Hypertension Mother   . Leukemia Father   . Leukemia Other     History  Substance Use Topics  . Smoking status: Current Everyday Smoker  . Smokeless tobacco: Not on file  . Alcohol Use: Yes      Review of Systems  Constitutional: Negative.  Negative for fever and chills.  Musculoskeletal: Positive for arthralgias. Negative for joint swelling.  Skin: Positive for color change and wound. Negative for pallor and rash.  Neurological: Negative for dizziness.    Allergies  Review of patient's allergies indicates no known allergies.  Home Medications   Current Outpatient Rx  Name Route Sig Dispense Refill  . DOXYCYCLINE HYCLATE 100 MG PO TABS Oral Take 100 mg by mouth 2 (two) times daily. For 14 days Started 6/12      BP 137/102  Pulse 89  Temp 99.3 F (37.4 C) (Oral)  Resp 18  SpO2 95%  Physical Exam  Nursing note and vitals reviewed. Constitutional: He appears well-developed and well-nourished.  Musculoskeletal: He exhibits no edema.       Pt has a wound on the third digit of the left hand. Pt unable to flex at the dip joint, very tender with limited ROM at dip joint, slight bruising to distal aspect  to the finger, jagged healing laceration noted, no active bleeding or discharge, cap refill is normal.   Neurological: He is alert.  Skin: Skin is warm and dry. No rash noted.       Capillary refill is normal.   Psychiatric: He has a normal mood and affect. His behavior is normal.    ED Course  Procedures (including critical care time)  DIAGNOSTIC STUDIES: Oxygen Saturation is 95% on room air, normal by my interpretation.    COORDINATION OF CARE:     Labs Reviewed  C-REACTIVE PROTEIN - Abnormal; Notable for the following:    CRP 0.24 (*)     All other  components within normal limits  CBC  DIFFERENTIAL  BASIC METABOLIC PANEL  SEDIMENTATION RATE  LAB REPORT - SCANNED   Dg Finger Middle Left  05/22/2012  *RADIOLOGY REPORT*  Clinical Data: Dog bite injury.  Initial injury 7 weeks ago. Recent re-injury.  LEFT MIDDLE FINGER 2+V  Comparison: 04/14/2012.  Findings:  Diffuse soft tissue swelling of the distal finger. There is irregular density in the soft tissues which is probably debris or foreign body.  This was not present previously.  No erosion or suggestion of osteomyelitis is present.  No fracture  IMPRESSION: Soft tissue swelling and radiopaque foreign body in the distal finger.  Negative for fracture or osteomyelitis.  Original Report Authenticated By: Camelia Phenes, M.D.     1. Bite wound of finger       MDM  I personally performed the services described in this documentation, which was scribed in my presence. The recorded information has been reviewed and considered.   Pt with chronic injury to middle finger tip, dog bite sustained several weeks ago, beign treated at wound clinic.  Pt sent here from routine followup visit due to continued severe pain.  Had discussed with Dr. Amanda Pea by wound center physician after I called Dr. Amanda Pea and patient was expected.  Dr. Amanda Pea desired plain film, labs and I placed pt in CDU holding.  Discussed with PAC Anne Shutter for further monitoring and follow up on tests.      Todd Friedman. Todd Balash, MD 05/24/12 1116

## 2012-05-22 NOTE — Consult Note (Signed)
  See dict # [id number] Minna Antis MD

## 2012-05-22 NOTE — ED Notes (Cosign Needed)
6:00 PM Assumed care of patient in the CDU from Dr. Oletta Lamas.  Patient with wound of the 3rd digit of the left hand.  Dr. Oletta Lamas has consulted Dr. Amanda Pea with Hand Surgery to come see patient in the ED.  Patient awaiting consult from Dr. Amanda Pea.  Patient reports that his pain is controlled at this time. Patient with wound of the left 3rd digit.  Patient unable to flex at the DIP joint and has tenderness of the distal left 3rd digit.  Normal capillary refill.  9:00 PM Dr. Amanda Pea in the room at this time evaluating the patient.   9:11 PM Patient evaluated and treated by Dr. Amanda Pea and Dr. Amanda Pea has discharged patient home.  Pascal Lux [place], PA-C 05/22/12 2112

## 2012-05-23 LAB — C-REACTIVE PROTEIN: CRP: 0.24 mg/dL — ABNORMAL LOW (ref <0.60)

## 2012-05-23 NOTE — Consult Note (Signed)
NAME:  CRISTHIAN, VANHOOK NO.:  [id number]  MEDICAL RECORD NO.:  [id number]  LOCATION:  CD10C                        FACILITY:  [facility]  PHYSICIAN:  Dionne Ano. Cheila Wickstrom, M.D.DATE OF BIRTH:  03-12-1961  DATE OF CONSULTATION: DATE OF DISCHARGE:  05/22/2012                                CONSULTATION   I had the pleasure to see Clint Guy for evaluation of his finger. His left middle finger sustained a dog bite approximately a month ago. He has been seen to [facility] and following this at the wound Care Center, we had an emergent call today from Dr. Wiliam Ke in regards to his finger.  There was a question whether this was infected to significant state.  He was seen in the ER, Dr. Oletta Lamas Triage team.  X-rays did not show any osteomyelitis.  The patient had no evidence of advanced osteomyelitis or significant infection.  He had heaped-up de- epithelialized skin tissue, which was rather tender due to the fact that he had been bitten, had a fracture and extreme sensitivity.  He had erythrocyte sedimentation rate performed.  The results are still pending.  In addition to this, the patient had a CBC, which was normal. I have reviewed this in detail.  The patient's sedimentation rate was 5.  PHYSICAL EXAMINATION:  GENERAL:  Pleasant male, alert and oriented and in no acute distress. VITAL SIGNS:  Stable. HEENT:  Within normal limits. CHEST:  Clear. EXTREMITIES:  Lower extremity examination is benign.  Left middle finger has heaped-up de-epithelialized skin tissue.  I consented him for irrigation and debridement.  The patient underwent a full-thickness debridement of the tip of his finger.  Following this, I performed cleansing of his nail, removed the distal portion of the nail and basically got his skin parameters back to normal capabilities.  We discussed with Alexxander the findings.  I have see no signs of infection.  I have debrided him.  He simply needs desensitization  and general care including range of motion, FDP isolation, Dove soap and water followed by Neosporin and see [place] back in 2-3 weeks if he is having any trouble.  It has been a pleasure to see him today.  These notes were discussed and all questions have been encouraged and answered.     Dionne Ano. Amanda Pea, M.D.     [facility]  D:  05/22/2012  T:  05/23/2012  Job:  [id number]

## 2012-05-29 ENCOUNTER — Encounter (HOSPITAL_BASED_OUTPATIENT_CLINIC_OR_DEPARTMENT_OTHER): Payer: Self-pay

## 2013-02-14 ENCOUNTER — Emergency Department (HOSPITAL_COMMUNITY): Payer: No Typology Code available for payment source

## 2013-02-14 ENCOUNTER — Encounter (HOSPITAL_COMMUNITY): Payer: Self-pay | Admitting: *Deleted

## 2013-02-14 ENCOUNTER — Emergency Department (HOSPITAL_COMMUNITY)
Admission: EM | Admit: 2013-02-14 | Discharge: 2013-02-14 | Disposition: A | Discharge status: Home / Self Care | Payer: No Typology Code available for payment source | Attending: Emergency Medicine | Admitting: Emergency Medicine

## 2013-02-14 DIAGNOSIS — I10 Essential (primary) hypertension: Secondary | ICD-10-CM | POA: Insufficient documentation

## 2013-02-14 DIAGNOSIS — Y9389 Activity, other specified: Secondary | ICD-10-CM | POA: Insufficient documentation

## 2013-02-14 DIAGNOSIS — F17200 Nicotine dependence, unspecified, uncomplicated: Secondary | ICD-10-CM | POA: Insufficient documentation

## 2013-02-14 DIAGNOSIS — S46909A Unspecified injury of unspecified muscle, fascia and tendon at shoulder and upper arm level, unspecified arm, initial encounter: Secondary | ICD-10-CM | POA: Insufficient documentation

## 2013-02-14 DIAGNOSIS — Z8639 Personal history of other endocrine, nutritional and metabolic disease: Secondary | ICD-10-CM | POA: Insufficient documentation

## 2013-02-14 DIAGNOSIS — Y92410 Unspecified street and highway as the place of occurrence of the external cause: Secondary | ICD-10-CM | POA: Insufficient documentation

## 2013-02-14 DIAGNOSIS — IMO0002 Reserved for concepts with insufficient information to code with codable children: Secondary | ICD-10-CM | POA: Insufficient documentation

## 2013-02-14 DIAGNOSIS — Z862 Personal history of diseases of the blood and blood-forming organs and certain disorders involving the immune mechanism: Secondary | ICD-10-CM | POA: Insufficient documentation

## 2013-02-14 DIAGNOSIS — M62830 Muscle spasm of back: Secondary | ICD-10-CM

## 2013-02-14 MED ORDER — IBUPROFEN 800 MG PO TABS: 800.0000 mg | ORAL_TABLET | Freq: Three times a day (TID) | ORAL | Status: DC

## 2013-02-14 MED ORDER — CYCLOBENZAPRINE HCL 5 MG PO TABS: 5.0000 mg | ORAL_TABLET | Freq: Two times a day (BID) | ORAL | Status: DC | PRN

## 2013-02-14 MED ORDER — IBUPROFEN 800 MG PO TABS
800.0000 mg | ORAL_TABLET | Freq: Once | ORAL | Status: AC
Start: 2013-02-14 — End: 2013-02-14
  Administered 2013-02-14: 800 mg via ORAL
  Filled 2013-02-14: qty 1
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
## 2013-02-14 NOTE — ED Notes (Signed)
Pt ambulating independently w/ steady gait on d/c in no acute distress, A&Ox4. D/c instructions reviewed w/ pt and family - pt and family deny any further questions or concerns at present. Rx given x2  

## 2013-02-18 ENCOUNTER — Emergency Department (INDEPENDENT_AMBULATORY_CARE_PROVIDER_SITE_OTHER)
Admission: EM | Admit: 2013-02-18 | Discharge: 2013-02-18 | Disposition: A | Discharge status: Home / Self Care | Payer: No Typology Code available for payment source | Source: Home / Self Care

## 2013-02-18 ENCOUNTER — Encounter (HOSPITAL_COMMUNITY): Payer: Self-pay | Admitting: *Deleted

## 2013-02-18 DIAGNOSIS — G8911 Acute pain due to trauma: Secondary | ICD-10-CM

## 2013-02-18 DIAGNOSIS — M791 Myalgia, unspecified site: Secondary | ICD-10-CM

## 2013-02-18 DIAGNOSIS — M545 Low back pain, unspecified: Secondary | ICD-10-CM

## 2013-02-18 DIAGNOSIS — IMO0001 Reserved for inherently not codable concepts without codable children: Secondary | ICD-10-CM

## 2013-02-18 MED ORDER — KETOROLAC TROMETHAMINE 30 MG/ML IJ SOLN: 30.0000 mg | Freq: Once | INTRAMUSCULAR | Status: DC

## 2013-02-18 MED ORDER — TRAMADOL-ACETAMINOPHEN 37.5-325 MG PO TABS: ORAL_TABLET | ORAL | Status: DC

## 2013-02-18 MED ORDER — KETOROLAC TROMETHAMINE 30 MG/ML IJ SOLN
INTRAMUSCULAR | Status: AC
  Filled 2013-02-18: qty 1

## 2013-02-18 MED ORDER — KETOROLAC TROMETHAMINE 30 MG/ML IJ SOLN
30.0000 mg | Freq: Once | INTRAMUSCULAR | Status: AC
  Administered 2013-02-18: 30 mg via INTRAMUSCULAR
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
## 2013-02-18 NOTE — ED Provider Notes (Signed)
Medical screening examination/treatment/procedure(s) were performed by non-physician practitioner and as supervising physician I was immediately available for consultation/collaboration.  Yazmyn Valbuena, M.D.  Opal Mckellips C Cindy Fullman, MD 02/18/13 2119 

## 2013-02-18 NOTE — ED Provider Notes (Signed)
Medical screening examination/treatment/procedure(s) were performed by non-physician practitioner and as supervising physician I was immediately available for consultation/collaboration.  Christopher J. Pollina, MD 02/18/13 2339 

## 2013-03-26 ENCOUNTER — Ambulatory Visit: Payer: No Typology Code available for payment source

## 2013-04-11 ENCOUNTER — Ambulatory Visit: Payer: No Typology Code available for payment source | Attending: Internal Medicine | Admitting: Physical Therapy

## 2013-04-11 DIAGNOSIS — M2550 Pain in unspecified joint: Secondary | ICD-10-CM | POA: Insufficient documentation

## 2013-04-11 DIAGNOSIS — IMO0001 Reserved for inherently not codable concepts without codable children: Secondary | ICD-10-CM | POA: Insufficient documentation

## 2013-04-16 ENCOUNTER — Ambulatory Visit: Payer: No Typology Code available for payment source

## 2013-04-19 ENCOUNTER — Ambulatory Visit: Payer: No Typology Code available for payment source | Admitting: Rehabilitation

## 2013-04-23 ENCOUNTER — Ambulatory Visit: Payer: No Typology Code available for payment source | Admitting: Physical Therapy

## 2013-04-25 ENCOUNTER — Ambulatory Visit: Payer: No Typology Code available for payment source | Admitting: Physical Therapy

## 2013-04-30 ENCOUNTER — Ambulatory Visit: Payer: No Typology Code available for payment source | Admitting: Physical Therapy

## 2013-05-02 ENCOUNTER — Ambulatory Visit: Payer: No Typology Code available for payment source | Admitting: Physical Therapy

## 2013-05-07 ENCOUNTER — Ambulatory Visit: Payer: No Typology Code available for payment source | Attending: Internal Medicine | Admitting: Rehabilitation

## 2013-05-07 DIAGNOSIS — M2550 Pain in unspecified joint: Secondary | ICD-10-CM | POA: Insufficient documentation

## 2013-05-07 DIAGNOSIS — IMO0001 Reserved for inherently not codable concepts without codable children: Secondary | ICD-10-CM | POA: Insufficient documentation

## 2013-05-09 ENCOUNTER — Ambulatory Visit: Payer: No Typology Code available for payment source | Admitting: Rehabilitation

## 2013-05-14 ENCOUNTER — Ambulatory Visit: Payer: No Typology Code available for payment source

## 2013-05-16 ENCOUNTER — Ambulatory Visit: Payer: No Typology Code available for payment source

## 2013-05-21 ENCOUNTER — Ambulatory Visit: Payer: No Typology Code available for payment source | Admitting: Rehabilitation

## 2013-05-27 ENCOUNTER — Ambulatory Visit: Payer: No Typology Code available for payment source | Admitting: Physical Therapy

## 2014-04-14 IMAGING — CR DG FINGER MIDDLE 2+V*L*
3 series · 3 of 3 positions shown · non-contrast
Comparison: None.

CLINICAL DATA: Dog bite.

LEFT MIDDLE FINGER 2+V

[x finger pa left]
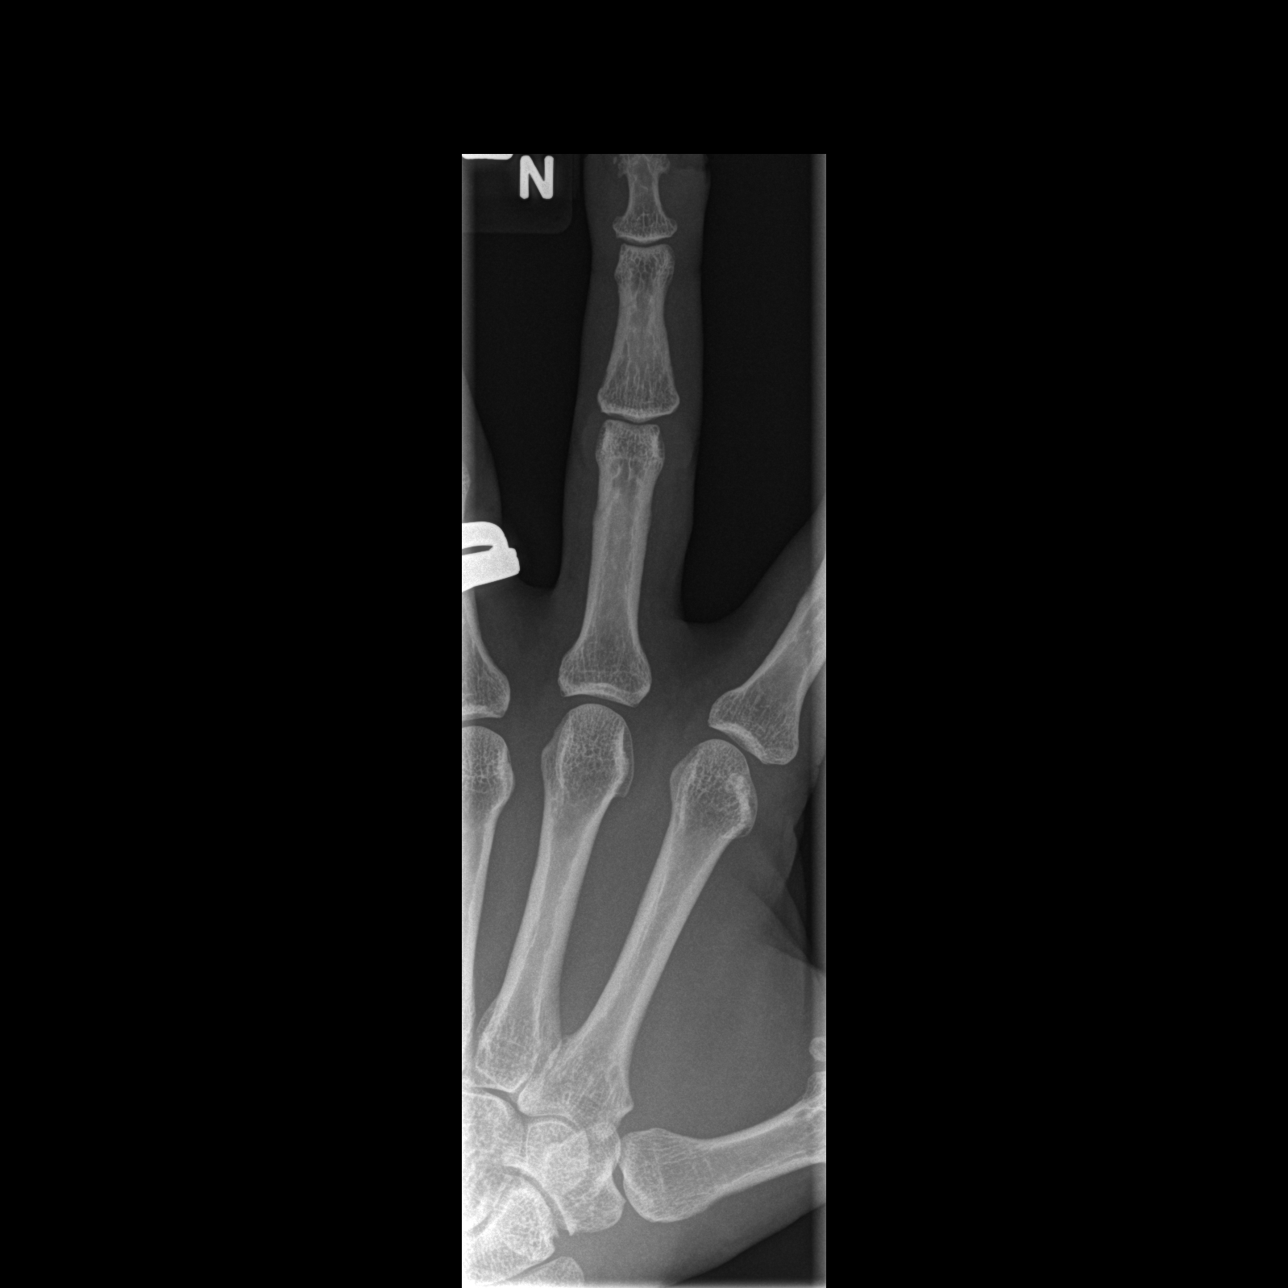

[x finger obl. left]
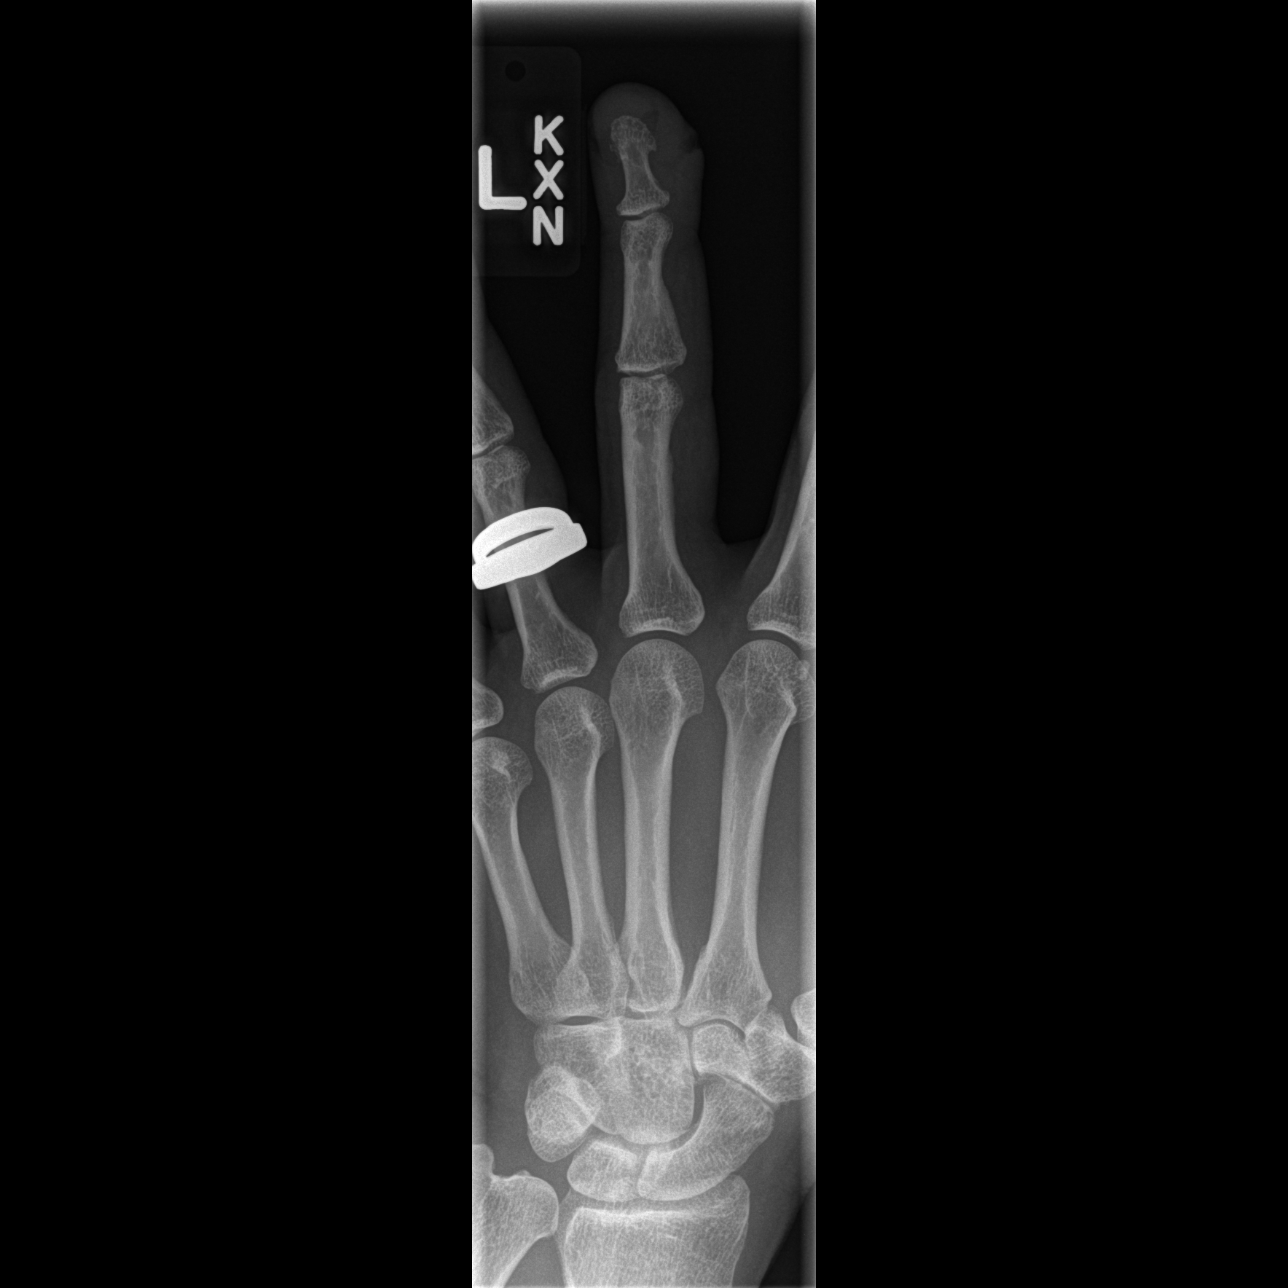

[x finger lateral left]
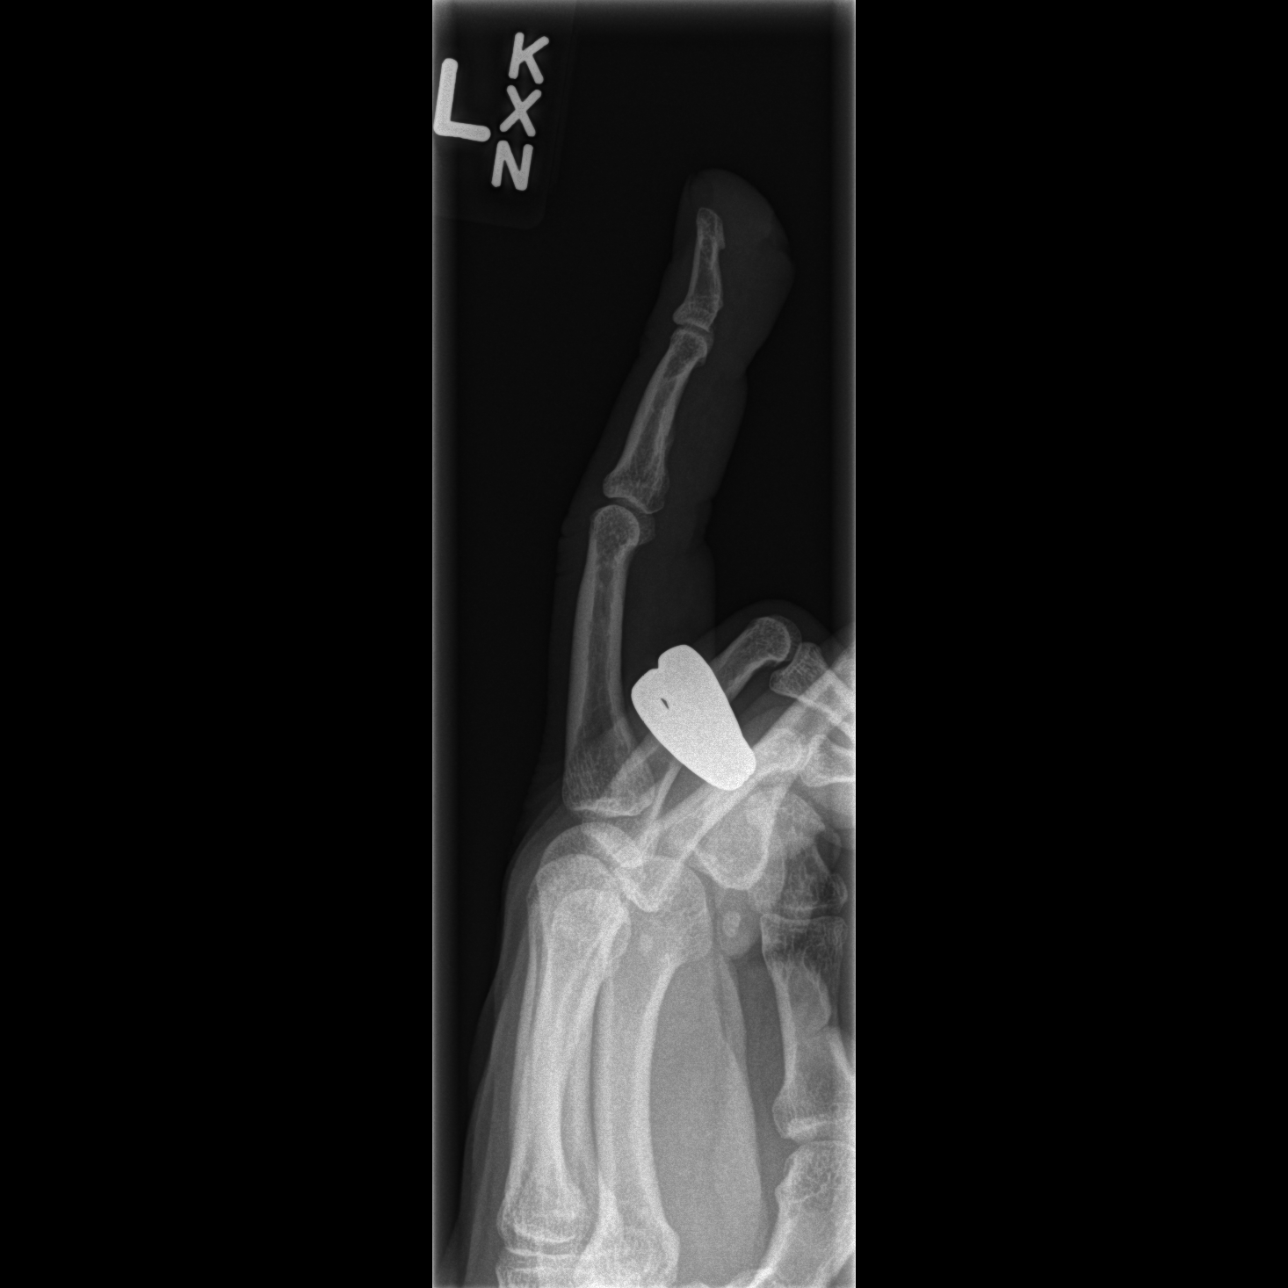

[3 of 3 positions shown; findings below may reference images not displayed]

FINDINGS: There is a laceration of the distal aspect of the left
long finger.  Chip fracture off of the medial aspect of the tuft is
noted.  Soft tissues are swollen.
IMPRESSION: Laceration with a chip fracture off the medial aspect of the tuft
of the long finger.

## 2014-09-16 ENCOUNTER — Encounter (HOSPITAL_COMMUNITY): Payer: Self-pay | Admitting: Emergency Medicine

## 2014-09-16 ENCOUNTER — Emergency Department (HOSPITAL_COMMUNITY)
Admission: EM | Admit: 2014-09-16 | Discharge: 2014-09-17 | Disposition: A | Discharge status: Other Acute Inpatient Hospital | Payer: No Typology Code available for payment source | Attending: Emergency Medicine | Admitting: Emergency Medicine

## 2014-09-16 DIAGNOSIS — Z720 Tobacco use: Secondary | ICD-10-CM | POA: Insufficient documentation

## 2014-09-16 DIAGNOSIS — F1010 Alcohol abuse, uncomplicated: Secondary | ICD-10-CM | POA: Insufficient documentation

## 2014-09-16 DIAGNOSIS — R1084 Generalized abdominal pain: Secondary | ICD-10-CM | POA: Insufficient documentation

## 2014-09-16 DIAGNOSIS — I10 Essential (primary) hypertension: Secondary | ICD-10-CM | POA: Insufficient documentation

## 2014-09-16 DIAGNOSIS — Z79899 Other long term (current) drug therapy: Secondary | ICD-10-CM | POA: Insufficient documentation

## 2014-09-16 DIAGNOSIS — Z8719 Personal history of other diseases of the digestive system: Secondary | ICD-10-CM | POA: Insufficient documentation

## 2014-09-16 DIAGNOSIS — F1210 Cannabis abuse, uncomplicated: Secondary | ICD-10-CM | POA: Insufficient documentation

## 2014-09-16 DIAGNOSIS — Z8639 Personal history of other endocrine, nutritional and metabolic disease: Secondary | ICD-10-CM | POA: Insufficient documentation

## 2014-09-16 HISTORY — DX: Irritable bowel syndrome, unspecified: K58.9

## 2014-09-16 LAB — COMPREHENSIVE METABOLIC PANEL
ALBUMIN: 4.2 g/dL (ref 3.5–5.2)
ALT: 90 U/L — AB (ref 0–53)
AST: 68 U/L — AB (ref 0–37)
Alkaline Phosphatase: 94 U/L (ref 39–117)
Anion gap: 16 — ABNORMAL HIGH (ref 5–15)
BUN: 4 mg/dL — ABNORMAL LOW (ref 6–23)
CALCIUM: 9.6 mg/dL (ref 8.4–10.5)
CO2: 24 mEq/L (ref 19–32)
Chloride: 103 mEq/L (ref 96–112)
Creatinine, Ser: 0.72 mg/dL (ref 0.50–1.35)
GFR calc non Af Amer: >90 mL/min (ref >90)
Glucose, Bld: 90 mg/dL (ref 70–99)
POTASSIUM: 3.8 meq/L (ref 3.7–5.3)
SODIUM: 143 meq/L (ref 137–147)
TOTAL PROTEIN: 8 g/dL (ref 6.0–8.3)
Total Bilirubin: 0.2 mg/dL — ABNORMAL LOW (ref 0.3–1.2)

## 2014-09-16 LAB — ETHANOL: Alcohol, Ethyl (B): 320 mg/dL — ABNORMAL HIGH (ref 0–11)

## 2014-09-16 LAB — CBC
HCT: 43 % (ref 39.0–52.0)
Hemoglobin: 15.3 g/dL (ref 13.0–17.0)
MCH: 32.2 pg (ref 26.0–34.0)
MCHC: 35.6 g/dL (ref 30.0–36.0)
MCV: 90.5 fL (ref 78.0–100.0)
PLATELETS: 297 10*3/uL (ref 150–400)
RBC: 4.75 MIL/uL (ref 4.22–5.81)
RDW: 14 % (ref 11.5–15.5)
WBC: 7.1 10*3/uL (ref 4.0–10.5)

## 2014-09-16 LAB — LIPASE, BLOOD: Lipase: 89 U/L — ABNORMAL HIGH (ref 11–59)

## 2014-09-16 LAB — ACETAMINOPHEN LEVEL

## 2014-09-16 LAB — SALICYLATE LEVEL

## 2014-09-16 MED ORDER — THIAMINE HCL 100 MG/ML IJ SOLN: 100.0000 mg | Freq: Every day | INTRAMUSCULAR | Status: DC

## 2014-09-16 MED ORDER — LORAZEPAM 2 MG/ML IJ SOLN: 0.0000 mg | Freq: Four times a day (QID) | INTRAMUSCULAR | Status: DC

## 2014-09-16 MED ORDER — SODIUM CHLORIDE 0.9 % IV BOLUS (SEPSIS)
1000.0000 mL | Freq: Once | INTRAVENOUS | Status: AC
  Administered 2014-09-17: 1000 mL via INTRAVENOUS

## 2014-09-16 MED ORDER — LORAZEPAM 2 MG/ML IJ SOLN: 0.0000 mg | Freq: Two times a day (BID) | INTRAMUSCULAR | Status: DC

## 2014-09-16 MED ORDER — LORAZEPAM 1 MG PO TABS
0.0000 mg | ORAL_TABLET | Freq: Four times a day (QID) | ORAL | Status: DC
  Administered 2014-09-17: 1 mg via ORAL
  Filled 2014-09-16: qty 1

## 2014-09-16 MED ORDER — LORAZEPAM 1 MG PO TABS: 0.0000 mg | ORAL_TABLET | Freq: Two times a day (BID) | ORAL | Status: DC

## 2014-09-16 MED ORDER — VITAMIN B-1 100 MG PO TABS
100.0000 mg | ORAL_TABLET | Freq: Every day | ORAL | Status: DC
  Administered 2014-09-17: 100 mg via ORAL
  Filled 2014-09-16: qty 1

## 2014-09-16 NOTE — ED Notes (Signed)
Patient arrives after asking wife to bring him to hospital. Patient has been abusing ETOH for about 2 years per family member. Patient obviously intoxicated in triage. Difficult to arouse but responds appropriately. Airway patent, respirations unlabored and even, NAD. Patient endorses drinking all day. States that he has been drinking liquor. Wife states that he returned home tonight via driving himself and requested help with his ETOH abuse.

## 2014-09-16 NOTE — ED Provider Notes (Signed)
CSN: [id number]     Arrival date & time 09/16/14  2231 History   First MD Initiated Contact with Patient 09/16/14 2258     Chief Complaint  Patient presents with  . Alcohol Intoxication  . Alcohol Problem  . Medical Clearance     (Consider location/radiation/quality/duration/timing/severity/associated sxs/prior Treatment) HPI Comments: Patient is a 53 year old male with history of hypertension, high cholesterol, IBS, alcohol abuse presents to the emergency department today for alcohol detox. He reports that he has been drinking all day. He drank approximately 1 pint of liquor today. He additionally smoked 3 blunts. He denies any additional drug use. He drinks every day. He has never tried to detox from alcohol in the past. His wife brought him to the emergency department today upon his request. She reports that he's been vomiting for the past 2 days. No blood in his emesis. No SI/HI or hallucinations.   Patient is a 53 y.o. male presenting with intoxication and alcohol problem. The history is provided by the patient and the spouse. No language interpreter was used.  Alcohol Intoxication Associated symptoms include abdominal pain, nausea and vomiting. Pertinent negatives include no chest pain, chills or fever.  Alcohol Problem Associated symptoms include abdominal pain, nausea and vomiting. Pertinent negatives include no chest pain, chills or fever.    Past Medical History  Diagnosis Date  . Hypertension   . High cholesterol   . Abdominal cramping, generalized   . IBS (irritable bowel syndrome)    History reviewed. No pertinent past surgical history. Family History  Problem Relation Age of Onset  . Cancer Mother   . Hypertension Mother   . Leukemia Father   . Leukemia Other    History  Substance Use Topics  . Smoking status: Current Every Day Smoker -- 0.50 packs/day    Types: Cigarettes  . Smokeless tobacco: Not on file  . Alcohol Use: Yes     Comment: "Lots of Liquor"     Review of Systems  Constitutional: Negative for fever and chills.  Respiratory: Negative for shortness of breath.   Cardiovascular: Negative for chest pain.  Gastrointestinal: Positive for nausea, vomiting and abdominal pain.  Psychiatric/Behavioral: Negative for suicidal ideas.  All other systems reviewed and are negative.     Allergies  Review of patient's allergies indicates no known allergies.  Home Medications   Prior to Admission medications   Medication Sig Start Date End Date Taking? Authorizing Provider  cyclobenzaprine (FLEXERIL) 5 MG tablet Take 1 tablet (5 mg total) by mouth 2 (two) times daily as needed for muscle spasms. 02/14/13   Tiffany Irine SealG Greene, PA-C  dicyclomine (BENTYL) 10 MG capsule Take 10 mg by mouth 4 (four) times daily -  before meals and at bedtime.    Historical Provider, MD  ibuprofen (ADVIL,MOTRIN) 800 MG tablet Take 1 tablet (800 mg total) by mouth 3 (three) times daily. 02/14/13   Tiffany Irine SealG Greene, PA-C  traMADol-acetaminophen (ULTRACET) 37.5-325 MG per tablet Take one or two tablets every 4 hours as needed for pain. Do not take with alcohol. 02/18/13   Hayden Rasmussenavid Mabe, NP   BP 167/93  Pulse 95  Temp(Src) 98.2 F (36.8 C) (Oral)  Resp 16  Ht 5\' 8"  (1.727 m)  Wt 165 lb (74.844 kg)  BMI 25.09 kg/m2  SpO2 94% Physical Exam  Nursing note and vitals reviewed. Constitutional: He is oriented to person, place, and time. He appears well-developed and well-nourished. No distress.  Patient appears intoxicated. Patient is easily awoken  to verbal stimuli, but quickly falls back asleep Patient in fetal position, as this is most comfortable position for him.   HENT:  Head: Normocephalic and atraumatic.  Right Ear: External ear normal.  Left Ear: External ear normal.  Nose: Nose normal.  Eyes: Conjunctivae are normal.  Neck: Normal range of motion. No tracheal deviation present.  Cardiovascular: Normal rate, regular rhythm and normal heart sounds.    Pulmonary/Chest: Effort normal and breath sounds normal. No stridor.  Abdominal: Soft. He exhibits no distension. There is generalized tenderness. There is no rigidity, no rebound and no guarding.  Generalized tenderness, worse in epigastric area.   Musculoskeletal: Normal range of motion.  Neurological: He is alert and oriented to person, place, and time.  Skin: Skin is warm and dry. He is not diaphoretic.  Psychiatric: He has a normal mood and affect. His behavior is normal. His speech is slurred.    ED Course  Procedures (including critical care time) Labs Review Labs Reviewed  COMPREHENSIVE METABOLIC PANEL - Abnormal; Notable for the following:    BUN 4 (*)    AST 68 (*)    ALT 90 (*)    Total Bilirubin 0.2 (*)    Anion gap 16 (*)    All other components within normal limits  ETHANOL - Abnormal; Notable for the following:    Alcohol, Ethyl (B) 320 (*)    All other components within normal limits  SALICYLATE LEVEL - Abnormal; Notable for the following:    Salicylate Lvl <2.0 (*)    All other components within normal limits  LIPASE, BLOOD - Abnormal; Notable for the following:    Lipase 89 (*)    All other components within normal limits  ACETAMINOPHEN LEVEL  CBC  URINE RAPID DRUG SCREEN (HOSP PERFORMED)  URINALYSIS, ROUTINE W REFLEX MICROSCOPIC    Imaging Review No results found.   EKG Interpretation None      MDM   Final diagnoses:  None    Patient presents emergency department for evaluation of abdominal pain and alcohol intoxication. He requested that his wife bring him in for alcohol detox. Patient is currently intoxicated with alcohol level of 320. Patient complaining of abdominal pain. Tender to palpation diffusely over abdomen, with maximal tenderness in epigastric area. Patient ordered Pepcid and protonix. Patient signed out to Manus RuddSchulz, NP at change of shift. She will reassess patient in approximately 1 hour. If no better, CT abd/pelvis. TTS was consulted  in hopes patient will be medically clear by am and be able to be evaluated. CIWA protocol ordered. Vital signs stable at this time. Discussed case with Dr. Mora Bellmanni who agrees with plan. Patient / Family / Caregiver informed of clinical course, understand medical decision-making process, and agree with plan.    Mora BellmanHannah S Essie Lagunes, PA-C 09/17/14 [phone]

## 2014-09-17 ENCOUNTER — Emergency Department (HOSPITAL_COMMUNITY): Payer: Self-pay

## 2014-09-17 ENCOUNTER — Encounter (HOSPITAL_COMMUNITY): Payer: Self-pay

## 2014-09-17 ENCOUNTER — Encounter (HOSPITAL_COMMUNITY): Payer: Self-pay | Admitting: *Deleted

## 2014-09-17 ENCOUNTER — Inpatient Hospital Stay (HOSPITAL_COMMUNITY)
Admission: AD | Admit: 2014-09-17 | Discharge: 2014-09-19 | DRG: 897 | Disposition: A | Discharge status: Home / Self Care | Payer: Federal, State, Local not specified - Other | Source: Intra-hospital | Attending: Psychiatry | Admitting: Psychiatry

## 2014-09-17 DIAGNOSIS — G4700 Insomnia, unspecified: Secondary | ICD-10-CM | POA: Diagnosis present

## 2014-09-17 DIAGNOSIS — F1994 Other psychoactive substance use, unspecified with psychoactive substance-induced mood disorder: Secondary | ICD-10-CM | POA: Diagnosis present

## 2014-09-17 DIAGNOSIS — E780 Pure hypercholesterolemia: Secondary | ICD-10-CM | POA: Diagnosis present

## 2014-09-17 DIAGNOSIS — Z599 Problem related to housing and economic circumstances, unspecified: Secondary | ICD-10-CM

## 2014-09-17 DIAGNOSIS — I10 Essential (primary) hypertension: Secondary | ICD-10-CM | POA: Diagnosis present

## 2014-09-17 DIAGNOSIS — F1020 Alcohol dependence, uncomplicated: Principal | ICD-10-CM | POA: Diagnosis present

## 2014-09-17 DIAGNOSIS — Z806 Family history of leukemia: Secondary | ICD-10-CM

## 2014-09-17 DIAGNOSIS — K589 Irritable bowel syndrome without diarrhea: Secondary | ICD-10-CM | POA: Diagnosis present

## 2014-09-17 DIAGNOSIS — Z23 Encounter for immunization: Secondary | ICD-10-CM

## 2014-09-17 DIAGNOSIS — F17210 Nicotine dependence, cigarettes, uncomplicated: Secondary | ICD-10-CM | POA: Diagnosis present

## 2014-09-17 DIAGNOSIS — F1010 Alcohol abuse, uncomplicated: Secondary | ICD-10-CM | POA: Diagnosis present

## 2014-09-17 DIAGNOSIS — F10230 Alcohol dependence with withdrawal, uncomplicated: Secondary | ICD-10-CM

## 2014-09-17 DIAGNOSIS — F1290 Cannabis use, unspecified, uncomplicated: Secondary | ICD-10-CM | POA: Diagnosis present

## 2014-09-17 DIAGNOSIS — Z8249 Family history of ischemic heart disease and other diseases of the circulatory system: Secondary | ICD-10-CM

## 2014-09-17 HISTORY — DX: Irritable bowel syndrome, unspecified: K58.9

## 2014-09-17 LAB — RAPID URINE DRUG SCREEN, HOSP PERFORMED
AMPHETAMINES: NOT DETECTED
Barbiturates: NOT DETECTED
Benzodiazepines: NOT DETECTED
COCAINE: NOT DETECTED
OPIATES: NOT DETECTED
Tetrahydrocannabinol: POSITIVE — AB

## 2014-09-17 LAB — URINALYSIS, ROUTINE W REFLEX MICROSCOPIC
Bilirubin Urine: NEGATIVE
GLUCOSE, UA: NEGATIVE mg/dL
KETONES UR: NEGATIVE mg/dL
LEUKOCYTES UA: NEGATIVE
Nitrite: NEGATIVE
PH: 6.5 (ref 5.0–8.0)
Protein, ur: NEGATIVE mg/dL
Specific Gravity, Urine: 1.038 — ABNORMAL HIGH (ref 1.005–1.030)
Urobilinogen, UA: 1 mg/dL (ref 0.0–1.0)

## 2014-09-17 LAB — URINE MICROSCOPIC-ADD ON

## 2014-09-17 LAB — ETHANOL: Alcohol, Ethyl (B): 154 mg/dL — ABNORMAL HIGH (ref 0–11)

## 2014-09-17 MED ORDER — NICOTINE 21 MG/24HR TD PT24
21.0000 mg | MEDICATED_PATCH | Freq: Every day | TRANSDERMAL | Status: DC
  Administered 2014-09-18 – 2014-09-19 (×2): 21 mg via TRANSDERMAL
  Filled 2014-09-17 (×4): qty 1

## 2014-09-17 MED ORDER — INFLUENZA VAC SPLIT QUAD 0.5 ML IM SUSY
0.5000 mL | PREFILLED_SYRINGE | INTRAMUSCULAR | Status: AC
  Administered 2014-09-18: 0.5 mL via INTRAMUSCULAR
  Filled 2014-09-17: qty 0.5

## 2014-09-17 MED ORDER — VITAMIN B-1 100 MG PO TABS
100.0000 mg | ORAL_TABLET | Freq: Every day | ORAL | Status: DC
  Administered 2014-09-18 – 2014-09-19 (×2): 100 mg via ORAL
  Filled 2014-09-17 (×5): qty 1

## 2014-09-17 MED ORDER — PNEUMOCOCCAL VAC POLYVALENT 25 MCG/0.5ML IJ INJ
0.5000 mL | INJECTION | INTRAMUSCULAR | Status: AC
  Administered 2014-09-18: 0.5 mL via INTRAMUSCULAR

## 2014-09-17 MED ORDER — LORAZEPAM 1 MG PO TABS: 1.0000 mg | ORAL_TABLET | Freq: Four times a day (QID) | ORAL | Status: DC | PRN

## 2014-09-17 MED ORDER — IOHEXOL 300 MG/ML  SOLN
25.0000 mL | Freq: Once | INTRAMUSCULAR | Status: AC | PRN
  Administered 2014-09-17: 25 mL via ORAL

## 2014-09-17 MED ORDER — LOPERAMIDE HCL 2 MG PO CAPS: 2.0000 mg | ORAL_CAPSULE | ORAL | Status: DC | PRN

## 2014-09-17 MED ORDER — ONDANSETRON HCL 4 MG PO TABS
4.0000 mg | ORAL_TABLET | Freq: Three times a day (TID) | ORAL | Status: DC | PRN
  Administered 2014-09-17: 4 mg via ORAL
  Filled 2014-09-17: qty 1

## 2014-09-17 MED ORDER — DICYCLOMINE HCL 10 MG PO CAPS: 10.0000 mg | ORAL_CAPSULE | Freq: Three times a day (TID) | ORAL | Status: DC

## 2014-09-17 MED ORDER — FAMOTIDINE IN NACL 20-0.9 MG/50ML-% IV SOLN
20.0000 mg | Freq: Once | INTRAVENOUS | Status: AC
  Administered 2014-09-17: 20 mg via INTRAVENOUS
  Filled 2014-09-17: qty 50

## 2014-09-17 MED ORDER — THIAMINE HCL 100 MG/ML IJ SOLN: 100.0000 mg | Freq: Once | INTRAMUSCULAR | Status: DC

## 2014-09-17 MED ORDER — LORAZEPAM 1 MG PO TABS: 1.0000 mg | ORAL_TABLET | Freq: Two times a day (BID) | ORAL | Status: DC

## 2014-09-17 MED ORDER — IOHEXOL 300 MG/ML  SOLN
100.0000 mL | Freq: Once | INTRAMUSCULAR | Status: AC | PRN
  Administered 2014-09-17: 100 mL via INTRAVENOUS

## 2014-09-17 MED ORDER — SODIUM CHLORIDE 0.9 % IV SOLN
80.0000 mg | Freq: Once | INTRAVENOUS | Status: AC
  Administered 2014-09-17: 80 mg via INTRAVENOUS
  Filled 2014-09-17: qty 80

## 2014-09-17 MED ORDER — LORAZEPAM 1 MG PO TABS
1.0000 mg | ORAL_TABLET | Freq: Three times a day (TID) | ORAL | Status: DC
  Administered 2014-09-19 (×2): 1 mg via ORAL
  Filled 2014-09-17 (×2): qty 1

## 2014-09-17 MED ORDER — ONDANSETRON 4 MG PO TBDP: 4.0000 mg | ORAL_TABLET | Freq: Four times a day (QID) | ORAL | Status: DC | PRN

## 2014-09-17 MED ORDER — TRAZODONE HCL 100 MG PO TABS
100.0000 mg | ORAL_TABLET | Freq: Every evening | ORAL | Status: DC | PRN
  Administered 2014-09-17 – 2014-09-19 (×2): 100 mg via ORAL
  Filled 2014-09-17: qty 14
  Filled 2014-09-17: qty 1

## 2014-09-17 MED ORDER — ACETAMINOPHEN 325 MG PO TABS
650.0000 mg | ORAL_TABLET | ORAL | Status: DC | PRN
  Administered 2014-09-17: 650 mg via ORAL
  Filled 2014-09-17: qty 2

## 2014-09-17 MED ORDER — HYDROXYZINE HCL 25 MG PO TABS
25.0000 mg | ORAL_TABLET | Freq: Four times a day (QID) | ORAL | Status: DC | PRN
  Filled 2014-09-17 (×3): qty 20

## 2014-09-17 MED ORDER — IBUPROFEN 200 MG PO TABS: 600.0000 mg | ORAL_TABLET | Freq: Three times a day (TID) | ORAL | Status: DC | PRN

## 2014-09-17 MED ORDER — ADULT MULTIVITAMIN W/MINERALS CH
1.0000 | ORAL_TABLET | Freq: Every day | ORAL | Status: DC
  Administered 2014-09-17 – 2014-09-19 (×3): 1 via ORAL
  Filled 2014-09-17 (×6): qty 1

## 2014-09-17 MED ORDER — LORAZEPAM 1 MG PO TABS
1.0000 mg | ORAL_TABLET | Freq: Four times a day (QID) | ORAL | Status: AC
  Administered 2014-09-17 – 2014-09-18 (×6): 1 mg via ORAL
  Filled 2014-09-17 (×6): qty 1

## 2014-09-17 MED ORDER — DICYCLOMINE HCL 10 MG PO CAPS
10.0000 mg | ORAL_CAPSULE | Freq: Three times a day (TID) | ORAL | Status: DC
  Administered 2014-09-17 – 2014-09-19 (×7): 10 mg via ORAL
  Filled 2014-09-17 (×3): qty 1
  Filled 2014-09-17: qty 12
  Filled 2014-09-17: qty 1
  Filled 2014-09-17 (×2): qty 12
  Filled 2014-09-17 (×8): qty 1
  Filled 2014-09-17: qty 12

## 2014-09-17 MED ORDER — ACETAMINOPHEN 325 MG PO TABS: 650.0000 mg | ORAL_TABLET | Freq: Four times a day (QID) | ORAL | Status: DC | PRN

## 2014-09-17 MED ORDER — MAGNESIUM HYDROXIDE 400 MG/5ML PO SUSP: 30.0000 mL | Freq: Every day | ORAL | Status: DC | PRN

## 2014-09-17 MED ORDER — ALUM & MAG HYDROXIDE-SIMETH 200-200-20 MG/5ML PO SUSP: 30.0000 mL | ORAL | Status: DC | PRN

## 2014-09-17 MED ORDER — NICOTINE 21 MG/24HR TD PT24
21.0000 mg | MEDICATED_PATCH | Freq: Every day | TRANSDERMAL | Status: DC
  Administered 2014-09-17: 21 mg via TRANSDERMAL
  Filled 2014-09-17: qty 1

## 2014-09-17 MED ORDER — LORAZEPAM 1 MG PO TABS: 1.0000 mg | ORAL_TABLET | Freq: Every day | ORAL | Status: DC

## 2014-09-17 MED ORDER — LORAZEPAM 1 MG PO TABS: 0.0000 mg | ORAL_TABLET | Freq: Two times a day (BID) | ORAL | Status: DC

## 2014-09-17 MED ORDER — ONDANSETRON HCL 4 MG/2ML IJ SOLN
4.0000 mg | Freq: Once | INTRAMUSCULAR | Status: AC
  Administered 2014-09-17: 4 mg via INTRAVENOUS
  Filled 2014-09-17: qty 2

## 2014-09-17 MED ORDER — ZOLPIDEM TARTRATE 5 MG PO TABS
5.0000 mg | ORAL_TABLET | Freq: Every evening | ORAL | Status: DC | PRN
Start: 2014-09-17 — End: 2014-09-17

## 2014-09-17 NOTE — ED Provider Notes (Signed)
6:38 AM Patient signed out to me at 0600 by Manus RuddSchulz, NP.  Patient wishes to get help with alcohol detox.  Additionally, he has had some upper abdominal pain.  CT is pending.  Patient re-evaluated.  He awakes easily and states that he is feeling better.  Currently denying any chest pain, SOB, n/v, or abdominal pain.  CT is negative for pancreatitis.  Some bladder wall thickening, but no urinary symptoms.  Doubt UTI.  Mildly prominent appendix, but no RLQ pain, no inflammation on CT, no white count of fever.  No signs of appy.  Patient medically clear at this time.  TTS consult is pending.  Results for orders placed during the hospital encounter of 09/16/14  ACETAMINOPHEN LEVEL      Result Value Ref Range   Acetaminophen (Tylenol), Serum <15.0  10 - 30 ug/mL  CBC      Result Value Ref Range   WBC 7.1  4.0 - 10.5 K/uL   RBC 4.75  4.22 - 5.81 MIL/uL   Hemoglobin 15.3  13.0 - 17.0 g/dL   HCT 60.443.0  54.039.0 - 98.152.0 %   MCV 90.5  78.0 - 100.0 fL   MCH 32.2  26.0 - 34.0 pg   MCHC 35.6  30.0 - 36.0 g/dL   RDW 19.114.0  47.811.5 - 29.515.5 %   Platelets 297  150 - 400 K/uL  COMPREHENSIVE METABOLIC PANEL      Result Value Ref Range   Sodium 143  137 - 147 mEq/L   Potassium 3.8  3.7 - 5.3 mEq/L   Chloride 103  96 - 112 mEq/L   CO2 24  19 - 32 mEq/L   Glucose, Bld 90  70 - 99 mg/dL   BUN 4 (*) 6 - 23 mg/dL   Creatinine, Ser 6.210.72  0.50 - 1.35 mg/dL   Calcium 9.6  8.4 - 30.810.5 mg/dL   Total Protein 8.0  6.0 - 8.3 g/dL   Albumin 4.2  3.5 - 5.2 g/dL   AST 68 (*) 0 - 37 U/L   ALT 90 (*) 0 - 53 U/L   Alkaline Phosphatase 94  39 - 117 U/L   Total Bilirubin 0.2 (*) 0.3 - 1.2 mg/dL   GFR calc non Af Amer >90  >90 mL/min   GFR calc Af Amer >90  >90 mL/min   Anion gap 16 (*) 5 - 15  ETHANOL      Result Value Ref Range   Alcohol, Ethyl (B) 320 (*) 0 - 11 mg/dL  SALICYLATE LEVEL      Result Value Ref Range   Salicylate Lvl <2.0 (*) 2.8 - 20.0 mg/dL  LIPASE, BLOOD      Result Value Ref Range   Lipase 89 (*) 11 -  59 U/L   Ct Abdomen Pelvis W Contrast  09/17/2014   CLINICAL DATA:  Initial evaluation for alcohol intoxication, elevated LFTs. Diffuse abdominal pain. Nausea and vomiting. Elevated lipase.  EXAM: CT ABDOMEN AND PELVIS WITH CONTRAST  TECHNIQUE: Multidetector CT imaging of the abdomen and pelvis was performed using the standard protocol following bolus administration of intravenous contrast.  CONTRAST:  100mL OMNIPAQUE IOHEXOL 300 MG/ML  SOLN  COMPARISON:  None.  FINDINGS: The visualized lung bases are clear. No pleural or pericardial effusion.  Diffuse hypoattenuation of the liver is suggestive of steatosis. 1.4 x 1.1 cm hypodense lesion present within the left hepatic lobe (series 2, image 16). There is suggestion of a a focus of peripheral nodular enhancement  within this lesion, suggesting benign hemangioma. A few additional scattered subcentimeter hypodensities within the right hepatic lobe are too small the characterize.  Gallbladder within normal limits. No biliary dilatation. Spleen is unremarkable. Adrenal glands within normal limits.  Pancreas demonstrates a normal appearance. No peripancreatic inflammation to suggest acute pancreatitis identified.  Kidneys are equal size with symmetric enhancement. No nephrolithiasis, hydronephrosis, or focal enhancing renal mass.  Stomach within normal limits. No evidence of bowel obstruction. No abnormal wall thickening, mucosal enhancement, or inflammatory fat stranding seen about the bowels. Appendix is visualized in the right lower quadrant and is mildly prominent measuring up to 8 mm in diameter rib without significant inflammatory changes to suggest acute appendicitis. No abnormal wall thickening, mucosal enhancement, or inflammatory fat stranding seen elsewhere about the bowels.  Bladder is moderately distended with circumferential bladder wall thickening. Prostate is mildly enlarged measuring up to 4.9 cm in diameter.  No free air or fluid. No adenopathy.  Moderate atherosclerotic disease present within the infrarenal aorta without aneurysm.  Severe degenerative changes present about the L4-5 intervertebral disc space. No acute osseous abnormality. No worrisome lytic or blastic osseous lesions.  IMPRESSION: 1. No CT evidence of acute intra-abdominal or pelvic process. Specifically, there is no CT evidence for acute pancreatitis at this time. 2. Mild circumferential bladder wall thickening, which may be related to incomplete distension, although acute cystitis could also have this appearance. Correlation with urinalysis recommended 3. Mildly prominent appendix measuring up to 8 mm in diameter without associated inflammatory changes to suggest acute appendicitis. 4. Hepatic steatosis with probable 1.4 cm hemangioma within the left hepatic lobe. 5. Moderate calcified atheromatous disease within the infrarenal aorta without aneurysm. 6. Mildly enlarged prostate.   Electronically Signed   By: Rise MuBenjamin  McClintock M.D.   On: 09/17/2014 06:08    Patient accepted for inpatient treatment.  Roxy Horsemanobert Salaya Holtrop, PA-C 09/17/14 1454

## 2014-09-17 NOTE — ED Notes (Signed)
Spoke to mary at [facility].advised her pt is ready for his assessment

## 2014-09-17 NOTE — ED Provider Notes (Signed)
After medication still having pain in LUQ  Noted to have elevated Lipase and LFT will obtain CT Scan   Arman FilterGail K Derrico Zhong, NP 09/17/14 [phone]
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
## 2014-09-17 NOTE — ED Notes (Signed)
Patient transported to CT 

## 2014-09-17 NOTE — ED Notes (Signed)
Wife's # is [phone]-Felicia

## 2014-09-17 NOTE — ED Notes (Signed)
Patient unable to void for Urine Drug Screen and Urinalysis at this time.  Will continue to monitor

## 2014-09-17 NOTE — ED Provider Notes (Signed)
Medical screening examination/treatment/procedure(s) were performed by non-physician practitioner and as supervising physician I was immediately available for consultation/collaboration.   EKG Interpretation None        Tomasita CrumbleAdeleke Lonny Eisen, MD 09/17/14 1417

## 2014-09-17 NOTE — BH Assessment (Addendum)
Spoke with POD C nurse Annette regarding assessing patient. Per nurse pt is still too intoxicated to be assessed at this time. She will call when patient is awake alert, oriented and able to participate in assessment.  Glorious PeachNajah Deira Shimer, MS, LCASA Assessment Counselor

## 2014-09-17 NOTE — BH Assessment (Signed)
TTS consult in progress.   Glorious PeachNajah Laurielle Selmon, MS, LCASA Assessment Counselor

## 2014-09-17 NOTE — BH Assessment (Signed)
Consulted with Felipa Furnaceobert Browning-PA-C, ED Provider to obtain clinicals prior to assessing patient.   Todd PeachNajah Chade Pitner, MS, LCASA Assessment Counselor

## 2014-09-17 NOTE — BH Assessment (Addendum)
Tele Assessment Note   Todd Friedman is an 53 y.o. male. [facility] and requesting inpatient etoh detox treatment. Pt reports that he has been drinking for years and can't stop drinking on his own. Pt reports that he consumes at least a 24 case of beer or > daily. Pt states, "I have an alcohol problem and I need help". Pt reports secondary THC use at least 3x per week. Pt reports that his last usreported was 3 days ago. Pt reports recent weight loss due to decreased appetite. Pt reports that he has loss 12lbs within the past month. Patient reports that he discontinued his medication prescribed to treat his IBS condition. Pt discontinued his medication because he could not continue to take it and drink at that same time. Pt reports no prior history of alcohol or SA abuse treatment. Pt denies history of violence or aggression and no legal prior or legal issues reported. Pt denies history of DT's or Seizures. Pt denies SI,HI, and no AVH reported.  Consulted with AC Thurman CoyerEric Kaplan and Morrell RiddleLaura Davis,NP whom are recommending inpatient treatment as patient meets inpatient treatment criteria for detox treatment. Pt assigned to bed 301-1. AC Thurman CoyerEric Kaplan spoke with ED nurse whom was in agreement with ensuring that support voluntary consent form was completed by patient [facility]. Pt's ED nurse will coordinate transport via [facility] and call nursing report to [phone].  Axis I: Alcohol Use Disorder, Severe Axis II: Deferred Axis III:  Past Medical History  Diagnosis Date  . Hypertension   . High cholesterol   . Abdominal cramping, generalized   . IBS (irritable bowel syndrome)   . Irritable bowel syndrome (IBS)    Axis IV: economic problems, housing problems and problems related to social environment Axis V: 31-40 impairment in reality testing  Past Medical History:  Past Medical History  Diagnosis Date  . Hypertension   . High cholesterol   . Abdominal  cramping, generalized   . IBS (irritable bowel syndrome)   . Irritable bowel syndrome (IBS)     History reviewed. No pertinent past surgical history.  Family History:  Family History  Problem Relation Age of Onset  . Cancer Mother   . Hypertension Mother   . Leukemia Father   . Leukemia Other     Social History:  reports that he has been smoking Cigarettes.  He has been smoking about 0.50 packs per day. He does not have any smokeless tobacco history on file. He reports that he drinks alcohol. He reports that he uses illicit drugs (Marijuana).  Additional Social History:  Alcohol / Drug Use History of alcohol / drug use?: Yes Negative Consequences of Use: Financial;Personal relationships Withdrawal Symptoms: Irritability;Tremors Substance #1 Name of Substance 1:  (Etoh-beer) 1 - Age of First Use:  (10) 1 - Amount (size/oz):  (24 case of beer>) 1 - Frequency:  (daily) 1 - Duration:  (increase use over past yr, on-going use for years) 1 - Last Use / Amount:  (09/16/14-1 case of beer=24 beers.) Substance #2 Name of Substance 2:  (THC) 2 - Age of First Use:  (14) 2 - Amount (size/oz):  (1 blunt) 2 - Frequency:  (3x per week) 2 - Duration:  (on-going use for years) 2 - Last Use / Amount:  (09/15/14-1 blunt)  CIWA: CIWA-Ar BP: 154/105 mmHg Pulse Rate: 96 Nausea and Vomiting: no nausea and no vomiting Tactile Disturbances: none Tremor: two Auditory Disturbances: not present  Paroxysmal Sweats: no sweat visible Visual Disturbances: not present Anxiety: two Headache, Fullness in Head: very mild Agitation: normal activity Orientation and Clouding of Sensorium: oriented and can do serial additions CIWA-Ar Total: 5 COWS:    PATIENT STRENGTHS: (choose at least two) Ability for insight Average or above average intelligence Capable of independent living Supportive family/friends Work skills  Allergies: No Known Allergies  Home Medications:  Medications Prior to Admission   Medication Sig Dispense Refill  . cyclobenzaprine (FLEXERIL) 5 MG tablet Take 1 tablet (5 mg total) by mouth 2 (two) times daily as needed for muscle spasms.  20 tablet  0  . dicyclomine (BENTYL) 10 MG capsule Take 10 mg by mouth 4 (four) times daily -  before meals and at bedtime.      Marland Kitchen. ibuprofen (ADVIL,MOTRIN) 800 MG tablet Take 1 tablet (800 mg total) by mouth 3 (three) times daily.  21 tablet  0  . traMADol-acetaminophen (ULTRACET) 37.5-325 MG per tablet Take one or two tablets every 4 hours as needed for pain. Do not take with alcohol.  30 tablet  0    OB/GYN Status:  No LMP for male patient.  General Assessment Data [facility] Is this a Tele or Face-to-Face Assessment?: Tele Assessment Is this an Initial Assessment or a Re-assessment for this encounter?: Initial Assessment Living Arrangements: Spouse/significant other Can pt return to current living arrangement?: Yes Admission Status: Voluntary Is patient capable of signing voluntary admission?: Yes Transfer from: Unknown Referral Source: MD     [facility] Living Arrangements: Spouse/significant other Name of Psychiatrist: No Current Provider Name of Therapist: No Current Provider     Risk to self with the past 6 months Suicidal Ideation: No Suicidal Intent: No Is patient at risk for suicide?: No Suicidal Plan?: No Access to Means: No What has been your use of drugs/alcohol within the last 12 months?: Etoh and THC Previous Attempts/Gestures: No How many times?: 0 Other Self Harm Risks: none reported Triggers for Past Attempts: None known Intentional Self Injurious Behavior: None Family Suicide History: No Recent stressful life event(s): Financial Problems;Other (Comment) (struggling to support himself due to lack of work hours) Persecutory voices/beliefs?: No Depression: No Substance abuse history and/or treatment for substance abuse?: Yes Suicide prevention information given to  non-admitted patients: Not applicable  Risk to Others within the past 6 months Homicidal Ideation: No Thoughts of Harm to Others: No Current Homicidal Intent: No Current Homicidal Plan: No Access to Homicidal Means: No Identified Victim: na History of harm to others?: No Assessment of Violence: None Noted Violent Behavior Description: None Noted Does patient have access to weapons?: No Criminal Charges Pending?: No Does patient have a court date: No  Psychosis Hallucinations: None noted Delusions: None noted  Mental Status Report Appear/Hygiene: Disheveled;In scrubs Eye Contact: Fair Motor Activity: Freedom of movement Speech: Logical/coherent Level of Consciousness: Alert Mood: Depressed;Anxious Affect: Flat;Anxious Anxiety Level: Minimal Thought Processes: Coherent;Relevant Judgement: Impaired Orientation: Person;Place;Time;Situation Obsessive Compulsive Thoughts/Behaviors: None  Cognitive Functioning Concentration: Normal Memory: Recent Intact;Remote Intact IQ: Average Insight: Fair Impulse Control: Poor Appetite: Poor Weight Loss: 12 Weight Gain: 0 Sleep: Decreased Total Hours of Sleep: 4 Vegetative Symptoms: None  ADLScreening [facility]) Patient's cognitive ability adequate to safely complete daily activities?: Yes Patient able to express need for assistance with ADLs?: Yes Independently performs ADLs?: Yes (appropriate for developmental age)  Prior Inpatient Therapy Prior Inpatient Therapy: No Prior Therapy Dates: na Prior Therapy Facilty/Provider(s): na Reason for  Treatment: na  Prior Outpatient Therapy Prior Outpatient Therapy: No Prior Therapy Dates: na Prior Therapy Facilty/Provider(s): na Reason for Treatment: na  ADL Screening (condition at time of admission) Patient's cognitive ability adequate to safely complete daily activities?: Yes Is the patient deaf or have difficulty hearing?: No Does the patient have difficulty  seeing, even when wearing glasses/contacts?: No Does the patient have difficulty concentrating, remembering, or making decisions?: No Patient able to express need for assistance with ADLs?: Yes Does the patient have difficulty dressing or bathing?: No Independently performs ADLs?: Yes (appropriate for developmental age) Does the patient have difficulty walking or climbing stairs?: No Weakness of Legs: None Weakness of Arms/Hands: None  Home Assistive Devices/Equipment Home Assistive Devices/Equipment: None  Therapy Consults (therapy consults require a physician order) PT Evaluation Needed: No OT Evalulation Needed: No SLP Evaluation Needed: No Abuse/Neglect Assessment (Assessment to be complete while patient is alone) Physical Abuse: Denies Verbal Abuse: Denies Sexual Abuse: Denies Exploitation of patient/patient's resources: Denies Self-Neglect: Denies Values / Beliefs Cultural Requests During Hospitalization: None Spiritual Requests During Hospitalization: None Consults Spiritual Care Consult Needed: No Social Work Consult Needed: No Merchant navy officer (For Healthcare) Does patient have an advance directive?: No Would patient like information on creating an advanced directive?: Yes English as a second language teacher given Nutrition Screen- [facility] Adult/[facility]/AP Patient's home diet: Regular  Additional Information 1:1 In Past 12 Months?: No CIRT Risk: No Elopement Risk: No Does patient have medical clearance?: Yes     Disposition:  Disposition Initial Assessment Completed for this Encounter: Yes Disposition of Patient: Inpatient treatment program (Per Morrell Riddle pt accepted to [facility] bed 301-1.) Type of inpatient treatment program: Adult  Gerline Legacy, MS, LCASA Assessment Counselor  09/17/2014 5:24 PM

## 2014-09-17 NOTE — BH Assessment (Signed)
Per RN pt is too intoxicated for assessment at this time and unable to stay awake. They will contact TTS when pt is ready for assessment. Per Dahlia ClientHannah PA-C's note pt is not yet medically cleared as labs are pending, and awaiting to see if abdominal pain resolves with medication ordered.   Clista BernhardtNancy Caraline Deutschman, [facility] Triage Specialist 09/17/2014 2:20 AM

## 2014-09-17 NOTE — ED Provider Notes (Signed)
Medical screening examination/treatment/procedure(s) were performed by non-physician practitioner and as supervising physician I was immediately available for consultation/collaboration.   EKG Interpretation None        Tomasita CrumbleAdeleke Taydon Nasworthy, MD 09/17/14 1415

## 2014-09-17 NOTE — ED Provider Notes (Signed)
[facility]  Vida RollerBrian D Kinney Sackmann, MD 09/17/14 [phone]

## 2014-09-17 NOTE — Tx Team (Signed)
Initial Interdisciplinary Treatment Plan   PATIENT STRESSORS: Health problems Substance abuse   PROBLEM LIST: Problem List/Patient Goals Date to be addressed Date deferred Reason deferred Estimated date of resolution  "stop drinking" 09/17/2014     "get my appetite back" 09/17/2014                                                DISCHARGE CRITERIA:  Ability to meet basic life and health needs Improved stabilization in mood, thinking, and/or behavior Motivation to continue treatment in a less acute level of care Need for constant or close observation no longer present Reduction of life-threatening or endangering symptoms to within safe limits Withdrawal symptoms are absent or subacute and managed without 24-hour nursing intervention  PRELIMINARY DISCHARGE PLAN: Attend aftercare/continuing care group Attend 12-step recovery group Outpatient therapy  PATIENT/FAMIILY INVOLVEMENT: This treatment plan has been presented to and reviewed with the patient, Todd Friedman.  The patient has been given the opportunity to ask questions and make suggestions.  Jule SerKent, Yolando Gillum Gail 09/17/2014, 3:38 PM

## 2014-09-17 NOTE — [facility] Group Notes (Signed)
Adult Psychoeducational Group Note  Date:  09/17/2014 Time:  10:29 PM  Group Topic/Focus:  NA Meeting  Participation Level:  Active  Participation Quality:  Appropriate  Affect:  Flat  Cognitive:  Appropriate  Insight: Appropriate  Engagement in Group:  Engaged  Modes of Intervention:  Discussion and Education  Additional Comments:  Todd AlstromMaurice attended group.  Caroll RancherLindsay, Wileen Duncanson A 09/17/2014, 10:29 PM

## 2014-09-17 NOTE — ED Notes (Signed)
[facility] called to transport. Patient does not have any belongings here. His wife took them home last night

## 2014-09-18 ENCOUNTER — Encounter (HOSPITAL_COMMUNITY): Payer: Self-pay | Admitting: Psychiatry

## 2014-09-18 DIAGNOSIS — F1094 Alcohol use, unspecified with alcohol-induced mood disorder: Secondary | ICD-10-CM

## 2014-09-18 DIAGNOSIS — F1994 Other psychoactive substance use, unspecified with psychoactive substance-induced mood disorder: Secondary | ICD-10-CM | POA: Diagnosis present

## 2014-09-18 DIAGNOSIS — F1020 Alcohol dependence, uncomplicated: Secondary | ICD-10-CM | POA: Diagnosis present

## 2014-09-18 MED ORDER — HYDROCHLOROTHIAZIDE 25 MG PO TABS
25.0000 mg | ORAL_TABLET | Freq: Every day | ORAL | Status: DC
  Administered 2014-09-18 – 2014-09-19 (×2): 25 mg via ORAL
  Filled 2014-09-18 (×6): qty 1
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
## 2014-09-18 NOTE — [facility] Suicide Risk Assessment (Signed)
Suicide Risk Assessment  Admission Assessment     Nursing information obtained from:    Demographic factors:    Current Mental Status:    Loss Factors:    Historical Factors:    Risk Reduction Factors:    Total Time spent with patient: 45 minutes  CLINICAL FACTORS:   Alcohol/Substance Abuse/Dependencies  Psychiatric Specialty Exam:     Blood pressure 139/92, pulse 97, temperature 98.9 F (37.2 C), temperature source Oral, resp. rate 18, height 5' 7.75" (1.721 m), weight 61.122 kg (134 lb 12 oz).Body mass index is 20.64 kg/(m^2).  General Appearance: Fairly Groomed  Patent attorneyye Contact::  Fair  Speech:  Clear and Coherent  Volume:  Normal  Mood:  Anxious and worried  Affect:  anxious, worried  Thought Process:  Coherent and Goal Directed  Orientation:  Full (Time, Place, and Person)  Thought Content:  symptoms events worries concerns  Suicidal Thoughts:  No  Homicidal Thoughts:  No  Memory:  Immediate;   Fair Recent;   Fair Remote;   Fair  Judgement:  Fair  Insight:  Present  Psychomotor Activity:  Normal  Concentration:  Fair  Recall:  [employer]  Fund of Knowledge:NA  Language: Fair  Akathisia:  No  Handed:    AIMS (if indicated):     Assets:  Desire for Improvement Housing Social Support Vocational/Educational  Sleep:  Number of Hours: 6.25   Musculoskeletal: Strength & Muscle Tone: within normal limits Gait & Station: normal Patient leans: N/A  COGNITIVE FEATURES THAT CONTRIBUTE TO RISK: None identified    SUICIDE RISK:   Minimal: No identifiable suicidal ideation.  Patients presenting with no risk factors but with morbid ruminations; may be classified as minimal risk based on the severity of the depressive symptoms  PLAN OF CARE: Supportive approach/coping skills/relapse prevention                              Ativan Detox Protocol                              Reassess and address the co morbities  I certify that inpatient services furnished can reasonably be  expected to improve the patient's condition.  Belva Koziel A 09/18/2014, 11:34 AM
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
## 2014-09-18 NOTE — [facility] Suicide Risk Assessment (Signed)
[facility] INPATIENT:  Family/Significant Other Suicide Prevention Education  Suicide Prevention Education:  Education Completed; wife Anna GenreFelicia Peyton [phone],  (name of family member/significant other) has been identified by the patient as the family member/significant other with whom the patient will be residing, and identified as the person(s) who will aid the patient in the event of a mental health crisis (suicidal ideations/suicide attempt).  With written consent from the patient, the family member/significant other has been provided the following suicide prevention education, prior to the and/or following the discharge of the patient.  The suicide prevention education provided includes the following:  Suicide risk factors  Suicide prevention and interventions  National Suicide Hotline telephone number  [facility] assessment telephone number  [facility] Emergency Assistance 911  [facility] [place] and/or Residential Mobile Crisis Unit telephone number  Request made of family/significant other to:  Remove weapons (e.g., guns, rifles, knives), all items previously/currently identified as safety concern.    Remove drugs/medications (over-the-counter, prescriptions, illicit drugs), all items previously/currently identified as a safety concern.  The family member/significant other verbalizes understanding of the suicide prevention education information provided.  The family member/significant other agrees to remove the items of safety concern listed above.  Rinda Rollyson, [place] CarboKristin L 09/18/2014, 12:44 PM

## 2014-09-18 NOTE — Progress Notes (Signed)
Patient ID: Todd Friedman, male   DOB: 01/02/1961, 53 y.o.   MRN: [id number] He has been up and about interacting with peers and staff. Self inventory this AM: depression 2, hopelessness 1, anxiety 2, withdrawals of diarrhea, chilling, agitation, and nausea.( He denies withdrawals when asked and he has not requested a prn).Denies physical pain.but c/o stomach pain. Goals: to go back to work and let others acknowledge my preformance without alcohol in my system. To meet goal:not drink and go to N/A and AA meetings.  Written statement: When I came in this door I was feeling real bad. Today I say Thank you all for helping me. He has been going to groups and has been calm and cooperative.

## 2014-09-18 NOTE — Progress Notes (Signed)
Patient ID: Todd Friedman, male   DOB: 01/07/1961, 54 y.o.   MRN: [id number] D: Patient in room on approach. Pt reports he has been drinking heavily to self medicate.  Pt c/o abdominal cramping and hx of IBS.  Pt attended group this evening but mostly isolative to his room.  Pt denies SI/HI/AVH and pain. Cooperative with assessment. No acute distressed noted at this time.   A: Met with pt 1:1. Medications administered as prescribed. Writer encouraged pt to discuss feelings. Writer educated pt on s/s of withdrawals. Pt encouraged to come to staff with any question or concerns.   R: Patient is safe. He is complaint with medications and denies any adverse reaction.

## 2014-09-18 NOTE — Progress Notes (Signed)
Adult Psychoeducational Group Note  Date:  09/18/2014 Time:  10:43 PM  Group Topic/Focus:  Wrap-Up Group:   The focus of this group is to help patients review their daily goal of treatment and discuss progress on daily workbooks.  Participation Level:  Active  Participation Quality:  Appropriate  Affect:  Appropriate  Cognitive:  Alert  Insight: Good  Engagement in Group:  Engaged  Modes of Intervention:  Discussion  Additional Comments:  Pt stated he had a great day and everything went good. His goal for today was to attend a "mental health group with Scott". He's hoping to go home tomorrow. He was pleasant and cooperative during group this evening.   Gu[place]ford Shihomas, Lanyla Costello K 09/18/2014, 10:43 PM

## 2014-09-18 NOTE — ED Provider Notes (Signed)
Medical screening examination/treatment/procedure(s) were performed by non-physician practitioner and as supervising physician I was immediately available for consultation/collaboration.   EKG Interpretation None        Oshea Percival, MD 09/18/14 0642 

## 2014-09-18 NOTE — [facility] Group Notes (Signed)
[facility] LCSW Group Therapy 09/18/2014 1:15 PM Type of Therapy: Group Therapy Participation Level: Active  Participation Quality: Attentive, Sharing and Supportive  Affect: Depressed and Flat  Cognitive: Alert and Oriented  Insight: Developing/Improving and Engaged  Engagement in Therapy: Developing/Improving and Engaged  Modes of Intervention: Activity, Clarification, Confrontation, Discussion, Education, Exploration, Limit-setting, Orientation, Problem-solving, Rapport Building, Reality Testing, Socialization and Support  Summary of Progress/Problems: Patient was attentive and engaged with speaker from [employer]. Patient was attentive to speaker while they shared their story of dealing with mental health and overcoming it. Patient expressed interest in their programs and services and received information on their agency. Patient processed ways they can relate to the speaker.   Aysia Lowder, MSW, LCSWA Clinical Social Worker [facility] [phone]   

## 2014-09-18 NOTE — Progress Notes (Signed)
The focus of this group is to educate the patient on the purpose and policies of crisis stabilization and provide a format to answer questions about their admission.  The group details unit policies and expectations of patients while admitted. Patient attended this group. 

## 2014-09-19 DIAGNOSIS — F1914 Other psychoactive substance abuse with psychoactive substance-induced mood disorder: Secondary | ICD-10-CM

## 2014-09-19 LAB — HEPATITIS PANEL, ACUTE
HCV Ab: REACTIVE — AB
HEP A IGM: NONREACTIVE
HEP B C IGM: NONREACTIVE
HEP B S AG: NEGATIVE

## 2014-09-19 MED ORDER — HYDROXYZINE HCL 25 MG PO TABS
25.0000 mg | ORAL_TABLET | Freq: Four times a day (QID) | ORAL | Status: DC | PRN
Start: 2014-09-19 — End: 2015-06-14

## 2014-09-19 MED ORDER — DICYCLOMINE HCL 10 MG PO CAPS: 10.0000 mg | ORAL_CAPSULE | Freq: Three times a day (TID) | ORAL | Status: DC

## 2014-09-19 MED ORDER — TRAZODONE HCL 100 MG PO TABS: 100.0000 mg | ORAL_TABLET | Freq: Every evening | ORAL | Status: DC | PRN

## 2014-09-19 MED ORDER — HYDROCHLOROTHIAZIDE 25 MG PO TABS: 25.0000 mg | ORAL_TABLET | Freq: Every day | ORAL | Status: DC

## 2014-09-19 NOTE — Progress Notes (Signed)
Todd Friedman is OOB UAL on the 300 hall tyhis morning. He is quiet. Denies complaints and / or problems of any sort. He completed his morning assessment as requested and on it he wrote he denied experiencing SI within the past 24 hrs and he rated his depression , aniety, and hopelessness "0/0/0".   A HE took his morning medications as scheduled and he is requesting to be dc'Todd today,staing he  " is ready" and that he needs to get home and take care of his family.   R POC includes MD assessing pt for DC preparations.

## 2014-09-19 NOTE — Progress Notes (Signed)
[facility] :  Will you be returning to the same living situation after discharge: Yes,  Patient is returning to his home. At discharge, do you have transportation home?:Yes,  Patient to arrange transportation home. Do you have the ability to pay for your medications:No.  Patient will be assisted with indigent medications.   Release of information consent forms completed and in the chart;  Patient's signature needed at discharge.  Patient to Follow up at: Follow-up Information   Follow up with [facility] On 09/25/2014. (Please present to [facility] on Thursday Oct. 22 at 4:45 pm to meet with Mr. [facility] for assessment for therapy and medication management services. Please call office if you need to reschedule. Bring insurance card.)    Contact information:   Address: [address], [place], [place] [address] [phone]      Patient denies SI/HI:   Patient no longer endorsing SI/HI or other thoughts of self harm.   Safety Planning and Suicide Prevention discussed:  .Reviewed with all patients during discharge planning group   Duvall Comes, Joesph JulyQuylle Hairston 09/19/2014, 12:24 PM

## 2014-09-19 NOTE — Plan of Care (Signed)
Problem: Alteration in mood & ability to function due to Goal: STG-Patient will comply with prescribed medication regimen (Patient will comply with prescribed medication regimen)  Outcome: Completed/Met Date Met:  09/19/14 Pt is complaint with medication regimen this shift
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
## 2014-09-19 NOTE — [facility] Suicide Risk Assessment (Signed)
Suicide Risk Assessment  Discharge Assessment     Demographic Factors:  Male  Total Time spent with patient: 30 minutes  Psychiatric Specialty Exam:     Blood pressure 109/87, pulse 114, temperature 98.7 F (37.1 C), temperature source Oral, resp. rate 14, height 5' 7.75" (1.721 m), weight 61.122 kg (134 lb 12 oz).Body mass index is 20.64 kg/(m^2).  General Appearance: Fairly Groomed  Patent attorneyye Contact::  Fair  Speech:  Clear and Coherent  Volume:  Normal  Mood:  Euthymic  Affect:  Appropriate  Thought Process:  Coherent and Goal Directed  Orientation:  Full (Time, Place, and Person)  Thought Content:  plans as he moves on, relapse prevention plan  Suicidal Thoughts:  No  Homicidal Thoughts:  No  Memory:  Immediate;   Fair Recent;   Fair Remote;   Fair  Judgement:  Fair  Insight:  Present  Psychomotor Activity:  Normal  Concentration:  Fair  Recall:  FiservFair  Fund of Knowledge:NA  Language: Fair  Akathisia:  No  Handed:    AIMS (if indicated):     Assets:  Desire for Improvement Housing Social Support  Sleep:  Number of Hours: 5.75    Musculoskeletal: Strength & Muscle Tone: within normal limits Gait & Station: normal Patient leans: N/A   Mental Status Per Nursing Assessment::   On Admission:     Current Mental Status by Physician: in full contact with reality. There are no active S/S of withdrawal. No active SI plans or intent. He was made aware of the Pos Screening for Hep C. He is going to to follow up with his PCP. He states he is ready to go back to work tomorrow.    Loss Factors: NA  Historical Factors: NA  Risk Reduction Factors:   Sense of responsibility to family, Employed and Living with another person, especially a relative, Positive Social Support  Continued Clinical Symptoms:  Alcohol/Substance Abuse/Dependencies  Cognitive Features That Contribute To Risk: None identified   Suicide Risk:  Minimal: No identifiable suicidal ideation.   Patients presenting with no risk factors but with morbid ruminations; may be classified as minimal risk based on the severity of the depressive symptoms  Discharge Diagnoses:   AXIS I:  Alcohol Dependence, S/P withdrawal, Substance Induced Mood Disorder AXIS II:  No diagnosis AXIS III:   Past Medical History  Diagnosis Date  . Hypertension   . High cholesterol   . Abdominal cramping, generalized   . IBS (irritable bowel syndrome)   . Irritable bowel syndrome (IBS)    AXIS IV:  other psychosocial or environmental problems AXIS V:  61-70 mild symptoms  Plan Of Care/Follow-up recommendations:  Activity:  as tolerated Diet:  regular Follow up with PCP Is patient on multiple antipsychotic therapies at discharge:  No   Has Patient had three or more failed trials of antipsychotic monotherapy by history:  No  Recommended Plan for Multiple Antipsychotic Therapies: NA    Karl Erway A 09/19/2014, 12:26 PM

## 2014-09-19 NOTE — Plan of Care (Signed)
Problem: Alteration in mood & ability to function due to Goal: STG-Patient will report withdrawal symptoms Outcome: Progressing Pt educated on s/s of withdrawal. Pt denies any withdrawal this shift

## 2014-09-19 NOTE — Progress Notes (Signed)
Patient ID: Todd Friedman, male   DOB: [birth year], 53 y.o.   MRN: [id number] D: Patient in room on approach. Pt reports he is doing well and denies any withdrawal symptoms. Pt has been mostly in his room reading. Pt attended evening wrap up group and engage in discussion. Pt denies SI/HI/AVH and pain. Cooperative with assessment. No acute distressed noted at this time.   A: Met with pt 1:1. Medications administered as prescribed. Writer encouraged pt to discuss feelings.  Pt encouraged to come to staff with any question or concerns.   R: Patient is safe. He is complaint with medications and denies any adverse reaction.

## 2014-09-19 NOTE — Progress Notes (Signed)
Pt denies any presence of withdrawal problems and states he wants to be dc'd today and that he feels like " i am ready to go home". He makes good eye contact. His affect and mood are much brighter than that which was descibed at the beginning of the week and he says  " I gotta do better when I go home..I know I can do it". He denies all s / sx of withdrawal and he is given DC AVS and DC med samples to take home with him and after DC AVS is reviewed with him he is escorted to bldg entrance and dc'd to home.
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
## 2014-09-23 NOTE — Progress Notes (Signed)
Patient Discharge Instructions:  After Visit Summary (AVS):   Faxed to:  09/23/14 Discharge Summary Note:   Faxed to:  09/23/14 Psychiatric Admission Assessment Note:   Faxed to:  09/23/14 Suicide Risk Assessment - Discharge Assessment:   Faxed to:  09/23/14 Faxed/Sent to the Next Level Care provider:  09/23/14 Faxed to [facility] @ [phone]  Jerelene ReddenSheena E Ziebach, 09/23/2014, 3:41 PM

## 2015-06-04 ENCOUNTER — Emergency Department (HOSPITAL_COMMUNITY)
Admission: EM | Admit: 2015-06-04 | Discharge: 2015-06-04 | Disposition: A | Discharge status: Home / Self Care | Payer: 59 | Attending: Emergency Medicine | Admitting: Emergency Medicine

## 2015-06-04 ENCOUNTER — Encounter (HOSPITAL_COMMUNITY): Payer: Self-pay

## 2015-06-04 DIAGNOSIS — Z8639 Personal history of other endocrine, nutritional and metabolic disease: Secondary | ICD-10-CM | POA: Insufficient documentation

## 2015-06-04 DIAGNOSIS — F1020 Alcohol dependence, uncomplicated: Secondary | ICD-10-CM | POA: Diagnosis not present

## 2015-06-04 DIAGNOSIS — Z79899 Other long term (current) drug therapy: Secondary | ICD-10-CM | POA: Diagnosis not present

## 2015-06-04 DIAGNOSIS — R109 Unspecified abdominal pain: Secondary | ICD-10-CM | POA: Diagnosis not present

## 2015-06-04 DIAGNOSIS — Z720 Tobacco use: Secondary | ICD-10-CM | POA: Insufficient documentation

## 2015-06-04 DIAGNOSIS — K589 Irritable bowel syndrome without diarrhea: Secondary | ICD-10-CM | POA: Insufficient documentation

## 2015-06-04 DIAGNOSIS — F1210 Cannabis abuse, uncomplicated: Secondary | ICD-10-CM | POA: Diagnosis not present

## 2015-06-04 DIAGNOSIS — I10 Essential (primary) hypertension: Secondary | ICD-10-CM | POA: Insufficient documentation

## 2015-06-04 DIAGNOSIS — F10129 Alcohol abuse with intoxication, unspecified: Secondary | ICD-10-CM | POA: Diagnosis present

## 2015-06-04 HISTORY — DX: Alcohol dependence, uncomplicated: F10.20

## 2015-06-04 LAB — COMPREHENSIVE METABOLIC PANEL
ALBUMIN: 4 g/dL (ref 3.5–5.0)
ALK PHOS: 82 U/L (ref 38–126)
ALT: 50 U/L (ref 17–63)
ANION GAP: 8 (ref 5–15)
AST: 60 U/L — ABNORMAL HIGH (ref 15–41)
BILIRUBIN TOTAL: 0.5 mg/dL (ref 0.3–1.2)
BUN: <5 mg/dL — ABNORMAL LOW (ref 6–20)
CALCIUM: 9 mg/dL (ref 8.9–10.3)
CO2: 26 mmol/L (ref 22–32)
Chloride: 106 mmol/L (ref 101–111)
Creatinine, Ser: 0.7 mg/dL (ref 0.61–1.24)
GLUCOSE: 96 mg/dL (ref 65–99)
POTASSIUM: 3.8 mmol/L (ref 3.5–5.1)
SODIUM: 140 mmol/L (ref 135–145)
TOTAL PROTEIN: 7.1 g/dL (ref 6.5–8.1)

## 2015-06-04 LAB — CBC WITH DIFFERENTIAL/PLATELET
Basophils Absolute: 0.1 10*3/uL (ref 0.0–0.1)
Basophils Relative: 1 % (ref 0–1)
EOS PCT: 1 % (ref 0–5)
Eosinophils Absolute: 0.1 10*3/uL (ref 0.0–0.7)
HCT: 40.7 % (ref 39.0–52.0)
Hemoglobin: 14.2 g/dL (ref 13.0–17.0)
Lymphocytes Relative: 27 % (ref 12–46)
Lymphs Abs: 1.7 10*3/uL (ref 0.7–4.0)
MCH: 31.3 pg (ref 26.0–34.0)
MCHC: 34.9 g/dL (ref 30.0–36.0)
MCV: 89.8 fL (ref 78.0–100.0)
Monocytes Absolute: 0.6 10*3/uL (ref 0.1–1.0)
Monocytes Relative: 10 % (ref 3–12)
Neutro Abs: 3.8 10*3/uL (ref 1.7–7.7)
Neutrophils Relative %: 61 % (ref 43–77)
Platelets: 221 10*3/uL (ref 150–400)
RBC: 4.53 MIL/uL (ref 4.22–5.81)
RDW: 14.3 % (ref 11.5–15.5)
WBC: 6.2 10*3/uL (ref 4.0–10.5)

## 2015-06-04 LAB — ACETAMINOPHEN LEVEL

## 2015-06-04 LAB — RAPID URINE DRUG SCREEN, HOSP PERFORMED
AMPHETAMINES: NOT DETECTED
BARBITURATES: NOT DETECTED
BENZODIAZEPINES: NOT DETECTED
COCAINE: NOT DETECTED
OPIATES: NOT DETECTED
Tetrahydrocannabinol: POSITIVE — AB

## 2015-06-04 LAB — ETHANOL: ALCOHOL ETHYL (B): 244 mg/dL — AB (ref <5)

## 2015-06-04 LAB — SALICYLATE LEVEL

## 2015-06-04 MED ORDER — THIAMINE HCL 100 MG/ML IJ SOLN: 100.0000 mg | Freq: Every day | INTRAMUSCULAR | Status: DC

## 2015-06-04 MED ORDER — CHLORDIAZEPOXIDE HCL 25 MG PO CAPS: ORAL_CAPSULE | ORAL | Status: DC

## 2015-06-04 MED ORDER — VITAMIN B-1 100 MG PO TABS
100.0000 mg | ORAL_TABLET | Freq: Every day | ORAL | Status: DC
  Administered 2015-06-04: 100 mg via ORAL
  Filled 2015-06-04: qty 1

## 2015-06-04 MED ORDER — LORAZEPAM 1 MG PO TABS: 0.0000 mg | ORAL_TABLET | Freq: Two times a day (BID) | ORAL | Status: DC

## 2015-06-04 MED ORDER — LORAZEPAM 1 MG PO TABS
0.0000 mg | ORAL_TABLET | Freq: Four times a day (QID) | ORAL | Status: DC
  Administered 2015-06-04: 1 mg via ORAL
  Filled 2015-06-04: qty 1
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
## 2015-06-04 NOTE — Progress Notes (Signed)
Todd DutyFelicia Evans [facility] Specialist [facility] [phone]  Spoke with patient regarding primary care and the [facility] orange card. Patient sts he has united health care insurance, I verified with registration that insurance is inactive. Orange Physicist, medicalcard application and resource guide provided. Instructed patient to call me once discharged with future assistance with the orange card. My contact information also given for any future questions or concerns. No other Community Health & Eligibility Specialist needs identified at this time.

## 2015-06-04 NOTE — Discharge Instructions (Signed)
Behavioral Health Resources in the Community ° °Intensive Outpatient Programs: °[facility]      °[address] °[place], [place] °[phone] °Both a day and evening program °      °[facility]     °[address]        °[place], [place] [address] °[phone]        ° °[facility] °[address] °[place] °336-882-2125 ° °[facility] °ACCESS LINE: 1-800-853-5163 or 336-641-4981 °[address] °[place], [place] [address] °[email or web address] ° °Mobile Crisis Teams:         °                               °[facility]         °Mobile Crisis Care Unit °1-877-626-1772       °      °Assertive °[facility] °[address] [place] °336-834-9664 °                                        °Interventionist °Sharon DeEsch °[address] °[place] °336-554-5454 ° °Self-Help/Support Groups: °[facility] Variety of support groups °373-1402 (call for more info) ° °Narcotics Anonymous (NA) °[facility] °[address] °[place] - 2 meetings at this location ° °Residential Treatment Programs:  °[facility]      °[address]        °[place]       °866-801-8205        ° °[facility] °[address] °[place], [place]  [address] °[phone] ° °[facility]  °[address] °[place], [place] [address] °[phone] °Admissions: 8am-3pm M-F ° °[facility]     °[address]        °[place], [place] [address]       °336-841-1104        ° °[facility] °[address] °[place], [place] °[phone] ° °[facility] °[address] °[place], [place] °336-285-9073 ° °[facility] Intensive Outpatient      °[address]     °[place], [place]       °852-3033        ° °[facility]        °[address] °[place], [place] °877-615-2722 or 336-784-9470 ° °[facility]  °[address] °[place], [place] °336-227-7417 ° °[facility]                                               °[address] °[place]  °800-659-3381 ° °[facility] Resources: °[facility]- 1-888-581-9988              ° °General Therapy                                                °Julie Brannon, PhD        °  [address]                                       [place], [place] [address]         [phone]   Insurance  [facility]    [address] [place], [place] [address] [phone]  [facility] [address] [place], [place] [address] [phone] [phone] Insurance/Medicaid/sponsorship through [facility]                                              [address]                                        [place], [place] [address]    Therapy/tele-psych/case         [phone]          [facility] [address]   [place], [place]  [address]  Adolescent/group home/case management [phone]                                           Creola CornJulia Brannon PhD       General therapy       Insurance   [phone]         Dr. Lolly MustacheArfeen Insurance [phone]- [phone] M-F  [facility] Medicaid, sponsorship [phone]     Alcohol Withdrawal Anytime drug use is interfering with normal living activities it has become abuse. This includes problems with family and friends. Psychological dependence has developed when your mind tells you that the drug is needed. This is usually followed by physical dependence when a continuing increase of drugs are required to get the same feeling or "high." This is known as addiction or chemical dependency. A person's risk is much higher if there is a history of chemical dependency in the family. Mild Withdrawal Following Stopping Alcohol, When Addiction or Chemical Dependency Has  Developed When a person has developed tolerance to alcohol, any sudden stopping of alcohol can cause uncomfortable physical symptoms. Most of the time these are mild and consist of tremors in the hands and increases in heart rate, breathing, and temperature. Sometimes these symptoms are associated with anxiety, panic attacks, and bad dreams. There may also be stomach upset. Normal sleep patterns are often interrupted with periods of inability to sleep (insomnia). This may last for 6 months. Because of this discomfort, many people choose to continue drinking to get rid of this discomfort and to try to feel normal. Severe Withdrawal with Decreased or No Alcohol Intake, When Addiction or Chemical Dependency Has Developed About five percent of alcoholics will develop signs of severe withdrawal when they stop using alcohol. One sign of this is development of generalized seizures (convulsions). Other signs of this are severe agitation and confusion. This may be associated with believing in things which are not real or seeing things which are not really there (delusions and hallucinations). Vitamin deficiencies are usually present if alcohol intake has been long-term. Treatment for this most often requires hospitalization and close observation. Addiction can only be helped  by stopping use of all chemicals. This is hard but may save your life. With continual alcohol use, possible outcomes are usually loss of self respect and esteem, violence, and death. Addiction cannot be cured but it can be stopped. This often requires outside help and the care of professionals. Treatment centers are listed in the yellow pages under Cocaine, Narcotics, and Alcoholics Anonymous. Most hospitals and clinics can refer you to a specialized care center. It is not necessary for you to go through the uncomfortable symptoms of withdrawal. Your caregiver can provide you with medicines that will help you through this difficult period. Try to  avoid situations, friends, or drugs that made it possible for you to keep using alcohol in the past. Learn how to say no. It takes a long period of time to overcome addictions to all drugs, including alcohol. There may be many times when you feel as though you want a drink. After getting rid of the physical addiction and withdrawal, you will have a lessening of the craving which tells you that you need alcohol to feel normal. Call your caregiver if more support is needed. Learn who to talk to in your family and among your friends so that during these periods you can receive outside help. Alcoholics Anonymous (AA) has helped many people over the years. To get further help, contact AA or call your caregiver, counselor, or clergyperson. Al-Anon and Alateen are support groups for friends and family members of an alcoholic. The people who love and care for an alcoholic often need help, too. For information about these organizations, check your phone directory or call a local alcoholism treatment center.  SEEK IMMEDIATE MEDICAL CARE IF:   You have a seizure.  You have a fever.  You experience uncontrolled vomiting or you vomit up blood. This may be bright red or look like black coffee grounds.  You have blood in the stool. This may be bright red or appear as a black, tarry, bad-smelling stool.  You become lightheaded or faint. Do not drive if you feel this way. Have someone else drive you or call 161 for help.  You become more agitated or confused.  You develop uncontrolled anxiety.  You begin to see things that are not really there (hallucinate). Your caregiver has determined that you completely understand your medical condition, and that your mental state is back to normal. You understand that you have been treated for alcohol withdrawal, have agreed not to drink any alcohol for a minimum of 1 day, will not operate a car or other machinery for 24 hours, and have had an opportunity to ask any questions  about your condition. Document Released: 08/31/2005 Document Revised: 02/13/2012 Document Reviewed: 07/09/2008 [facility] Patient Information 2015 [place], [place]. This information is not intended to replace advice given to you by your health care provider. Make sure you discuss any questions you have with your health care provider.
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
## 2015-06-14 ENCOUNTER — Encounter (HOSPITAL_COMMUNITY): Payer: Self-pay | Admitting: Emergency Medicine

## 2015-06-14 ENCOUNTER — Emergency Department (HOSPITAL_COMMUNITY)
Admission: EM | Admit: 2015-06-14 | Discharge: 2015-06-14 | Disposition: A | Discharge status: Home / Self Care | Payer: 59 | Attending: Emergency Medicine | Admitting: Emergency Medicine

## 2015-06-14 ENCOUNTER — Encounter (HOSPITAL_COMMUNITY): Payer: Self-pay | Admitting: *Deleted

## 2015-06-14 ENCOUNTER — Inpatient Hospital Stay (HOSPITAL_COMMUNITY)
Admission: AD | Admit: 2015-06-14 | Discharge: 2015-06-18 | DRG: 897 | Disposition: A | Discharge status: Other Acute Inpatient Hospital | Payer: 59 | Source: Intra-hospital | Attending: Emergency Medicine | Admitting: Emergency Medicine

## 2015-06-14 DIAGNOSIS — N179 Acute kidney failure, unspecified: Secondary | ICD-10-CM | POA: Diagnosis present

## 2015-06-14 DIAGNOSIS — F5105 Insomnia due to other mental disorder: Secondary | ICD-10-CM | POA: Diagnosis present

## 2015-06-14 DIAGNOSIS — F17210 Nicotine dependence, cigarettes, uncomplicated: Secondary | ICD-10-CM | POA: Diagnosis present

## 2015-06-14 DIAGNOSIS — F1020 Alcohol dependence, uncomplicated: Secondary | ICD-10-CM

## 2015-06-14 DIAGNOSIS — F1410 Cocaine abuse, uncomplicated: Secondary | ICD-10-CM | POA: Insufficient documentation

## 2015-06-14 DIAGNOSIS — Z79899 Other long term (current) drug therapy: Secondary | ICD-10-CM | POA: Insufficient documentation

## 2015-06-14 DIAGNOSIS — F1414 Cocaine abuse with cocaine-induced mood disorder: Secondary | ICD-10-CM | POA: Diagnosis present

## 2015-06-14 DIAGNOSIS — E780 Pure hypercholesterolemia: Secondary | ICD-10-CM | POA: Diagnosis present

## 2015-06-14 DIAGNOSIS — Z8639 Personal history of other endocrine, nutritional and metabolic disease: Secondary | ICD-10-CM | POA: Diagnosis not present

## 2015-06-14 DIAGNOSIS — F39 Unspecified mood [affective] disorder: Secondary | ICD-10-CM | POA: Diagnosis not present

## 2015-06-14 DIAGNOSIS — F1210 Cannabis abuse, uncomplicated: Secondary | ICD-10-CM | POA: Insufficient documentation

## 2015-06-14 DIAGNOSIS — F10231 Alcohol dependence with withdrawal delirium: Secondary | ICD-10-CM | POA: Diagnosis not present

## 2015-06-14 DIAGNOSIS — F329 Major depressive disorder, single episode, unspecified: Secondary | ICD-10-CM | POA: Diagnosis present

## 2015-06-14 DIAGNOSIS — R109 Unspecified abdominal pain: Secondary | ICD-10-CM | POA: Diagnosis present

## 2015-06-14 DIAGNOSIS — F10939 Alcohol use, unspecified with withdrawal, unspecified: Secondary | ICD-10-CM

## 2015-06-14 DIAGNOSIS — F4310 Post-traumatic stress disorder, unspecified: Secondary | ICD-10-CM | POA: Diagnosis present

## 2015-06-14 DIAGNOSIS — I10 Essential (primary) hypertension: Secondary | ICD-10-CM | POA: Diagnosis not present

## 2015-06-14 DIAGNOSIS — F10239 Alcohol dependence with withdrawal, unspecified: Secondary | ICD-10-CM

## 2015-06-14 DIAGNOSIS — F1010 Alcohol abuse, uncomplicated: Secondary | ICD-10-CM | POA: Diagnosis present

## 2015-06-14 DIAGNOSIS — K589 Irritable bowel syndrome without diarrhea: Secondary | ICD-10-CM | POA: Diagnosis not present

## 2015-06-14 DIAGNOSIS — F1994 Other psychoactive substance use, unspecified with psychoactive substance-induced mood disorder: Secondary | ICD-10-CM | POA: Diagnosis present

## 2015-06-14 DIAGNOSIS — E876 Hypokalemia: Secondary | ICD-10-CM | POA: Diagnosis present

## 2015-06-14 DIAGNOSIS — Z720 Tobacco use: Secondary | ICD-10-CM | POA: Insufficient documentation

## 2015-06-14 DIAGNOSIS — F10230 Alcohol dependence with withdrawal, uncomplicated: Secondary | ICD-10-CM | POA: Diagnosis not present

## 2015-06-14 LAB — CBC WITH DIFFERENTIAL/PLATELET
Basophils Absolute: 0.1 10*3/uL (ref 0.0–0.1)
Basophils Relative: 1 % (ref 0–1)
EOS PCT: 0 % (ref 0–5)
Eosinophils Absolute: 0 10*3/uL (ref 0.0–0.7)
HCT: 42.7 % (ref 39.0–52.0)
HEMOGLOBIN: 14.8 g/dL (ref 13.0–17.0)
LYMPHS ABS: 1.5 10*3/uL (ref 0.7–4.0)
Lymphocytes Relative: 20 % (ref 12–46)
MCH: 31.6 pg (ref 26.0–34.0)
MCHC: 34.7 g/dL (ref 30.0–36.0)
MCV: 91 fL (ref 78.0–100.0)
MONO ABS: 0.4 10*3/uL (ref 0.1–1.0)
Monocytes Relative: 6 % (ref 3–12)
Neutro Abs: 5.3 10*3/uL (ref 1.7–7.7)
Neutrophils Relative %: 73 % (ref 43–77)
Platelets: 219 10*3/uL (ref 150–400)
RBC: 4.69 MIL/uL (ref 4.22–5.81)
RDW: 14.5 % (ref 11.5–15.5)
WBC: 7.3 10*3/uL (ref 4.0–10.5)

## 2015-06-14 LAB — ACETAMINOPHEN LEVEL

## 2015-06-14 LAB — URINALYSIS, ROUTINE W REFLEX MICROSCOPIC
GLUCOSE, UA: NEGATIVE mg/dL
Ketones, ur: 15 mg/dL — AB
Leukocytes, UA: NEGATIVE
Nitrite: NEGATIVE
PH: 6 (ref 5.0–8.0)
Protein, ur: 100 mg/dL — AB
SPECIFIC GRAVITY, URINE: 1.024 (ref 1.005–1.030)
Urobilinogen, UA: 1 mg/dL (ref 0.0–1.0)

## 2015-06-14 LAB — COMPREHENSIVE METABOLIC PANEL
ALT: 66 U/L — ABNORMAL HIGH (ref 17–63)
AST: 85 U/L — ABNORMAL HIGH (ref 15–41)
Albumin: 4 g/dL (ref 3.5–5.0)
Alkaline Phosphatase: 92 U/L (ref 38–126)
Anion gap: 12 (ref 5–15)
BUN: <5 mg/dL — ABNORMAL LOW (ref 6–20)
CALCIUM: 9.5 mg/dL (ref 8.9–10.3)
CO2: 25 mmol/L (ref 22–32)
CREATININE: 0.87 mg/dL (ref 0.61–1.24)
Chloride: 103 mmol/L (ref 101–111)
GFR calc Af Amer: >60 mL/min (ref >60)
GLUCOSE: 156 mg/dL — AB (ref 65–99)
Potassium: 3.6 mmol/L (ref 3.5–5.1)
Sodium: 140 mmol/L (ref 135–145)
Total Bilirubin: 0.7 mg/dL (ref 0.3–1.2)
Total Protein: 6.9 g/dL (ref 6.5–8.1)

## 2015-06-14 LAB — RAPID URINE DRUG SCREEN, HOSP PERFORMED
Amphetamines: NOT DETECTED
Barbiturates: NOT DETECTED
Benzodiazepines: NOT DETECTED
COCAINE: POSITIVE — AB
OPIATES: NOT DETECTED
TETRAHYDROCANNABINOL: POSITIVE — AB

## 2015-06-14 LAB — URINE MICROSCOPIC-ADD ON

## 2015-06-14 LAB — LIPASE, BLOOD: LIPASE: 47 U/L (ref 22–51)

## 2015-06-14 LAB — ETHANOL: ALCOHOL ETHYL (B): 177 mg/dL — AB (ref <5)

## 2015-06-14 LAB — SALICYLATE LEVEL

## 2015-06-14 MED ORDER — VITAMIN B-1 100 MG PO TABS
100.0000 mg | ORAL_TABLET | Freq: Every day | ORAL | Status: DC
  Administered 2015-06-14: 100 mg via ORAL
  Filled 2015-06-14: qty 1

## 2015-06-14 MED ORDER — LORAZEPAM 1 MG PO TABS: 1.0000 mg | ORAL_TABLET | Freq: Three times a day (TID) | ORAL | Status: DC | PRN

## 2015-06-14 MED ORDER — IBUPROFEN 400 MG PO TABS: 600.0000 mg | ORAL_TABLET | Freq: Three times a day (TID) | ORAL | Status: DC | PRN

## 2015-06-14 MED ORDER — ACETAMINOPHEN 325 MG PO TABS: 650.0000 mg | ORAL_TABLET | ORAL | Status: DC | PRN

## 2015-06-14 MED ORDER — LORAZEPAM 1 MG PO TABS
1.0000 mg | ORAL_TABLET | Freq: Once | ORAL | Status: AC
  Administered 2015-06-14: 1 mg via ORAL
  Filled 2015-06-14: qty 1

## 2015-06-14 MED ORDER — VITAMIN B-1 100 MG PO TABS
100.0000 mg | ORAL_TABLET | Freq: Every day | ORAL | Status: DC
  Administered 2015-06-15 – 2015-06-18 (×4): 100 mg via ORAL
  Filled 2015-06-14 (×7): qty 1

## 2015-06-14 MED ORDER — LOPERAMIDE HCL 2 MG PO CAPS
2.0000 mg | ORAL_CAPSULE | ORAL | Status: AC | PRN
  Administered 2015-06-15: 4 mg via ORAL
  Filled 2015-06-14 (×2): qty 2

## 2015-06-14 MED ORDER — LORAZEPAM 1 MG PO TABS: 1.0000 mg | ORAL_TABLET | Freq: Every day | ORAL | Status: DC

## 2015-06-14 MED ORDER — ALUM & MAG HYDROXIDE-SIMETH 200-200-20 MG/5ML PO SUSP: 30.0000 mL | ORAL | Status: DC | PRN

## 2015-06-14 MED ORDER — LORAZEPAM 1 MG PO TABS
1.0000 mg | ORAL_TABLET | Freq: Once | ORAL | Status: AC
  Administered 2015-06-14: 1 mg via ORAL

## 2015-06-14 MED ORDER — LORAZEPAM 1 MG PO TABS
1.0000 mg | ORAL_TABLET | Freq: Four times a day (QID) | ORAL | Status: AC | PRN
  Administered 2015-06-15 – 2015-06-17 (×3): 1 mg via ORAL
  Filled 2015-06-14: qty 1
  Filled 2015-06-14: qty 2
  Filled 2015-06-14 (×2): qty 1

## 2015-06-14 MED ORDER — ACETAMINOPHEN 325 MG PO TABS: 650.0000 mg | ORAL_TABLET | Freq: Four times a day (QID) | ORAL | Status: DC | PRN

## 2015-06-14 MED ORDER — IBUPROFEN 800 MG PO TABS
800.0000 mg | ORAL_TABLET | Freq: Once | ORAL | Status: AC
  Administered 2015-06-14: 800 mg via ORAL
  Filled 2015-06-14: qty 1

## 2015-06-14 MED ORDER — ZOLPIDEM TARTRATE 5 MG PO TABS: 5.0000 mg | ORAL_TABLET | Freq: Every evening | ORAL | Status: DC | PRN

## 2015-06-14 MED ORDER — THIAMINE HCL 100 MG/ML IJ SOLN
100.0000 mg | Freq: Once | INTRAMUSCULAR | Status: AC
  Administered 2015-06-14: 100 mg via INTRAMUSCULAR
  Filled 2015-06-14: qty 2

## 2015-06-14 MED ORDER — ONDANSETRON HCL 4 MG PO TABS: 4.0000 mg | ORAL_TABLET | Freq: Three times a day (TID) | ORAL | Status: DC | PRN

## 2015-06-14 MED ORDER — ONDANSETRON 4 MG PO TBDP
4.0000 mg | ORAL_TABLET | Freq: Four times a day (QID) | ORAL | Status: AC | PRN
  Administered 2015-06-15 – 2015-06-16 (×2): 4 mg via ORAL
  Filled 2015-06-14 (×2): qty 1

## 2015-06-14 MED ORDER — ENSURE ENLIVE PO LIQD
237.0000 mL | Freq: Two times a day (BID) | ORAL | Status: DC
  Administered 2015-06-15 – 2015-06-17 (×5): 237 mL via ORAL
  Filled 2015-06-14 (×3): qty 237

## 2015-06-14 MED ORDER — ADULT MULTIVITAMIN W/MINERALS CH
1.0000 | ORAL_TABLET | Freq: Every day | ORAL | Status: DC
Start: 2015-06-14 — End: 2015-06-18
  Administered 2015-06-14 – 2015-06-18 (×5): 1 via ORAL
  Filled 2015-06-14 (×9): qty 1

## 2015-06-14 MED ORDER — LORAZEPAM 1 MG PO TABS
1.0000 mg | ORAL_TABLET | Freq: Two times a day (BID) | ORAL | Status: DC
  Administered 2015-06-17: 1 mg via ORAL
  Filled 2015-06-14: qty 2

## 2015-06-14 MED ORDER — LORAZEPAM 1 MG PO TABS: 0.0000 mg | ORAL_TABLET | Freq: Two times a day (BID) | ORAL | Status: DC

## 2015-06-14 MED ORDER — LORAZEPAM 1 MG PO TABS
1.0000 mg | ORAL_TABLET | Freq: Four times a day (QID) | ORAL | Status: AC
  Administered 2015-06-14 – 2015-06-15 (×6): 1 mg via ORAL
  Filled 2015-06-14 (×7): qty 1

## 2015-06-14 MED ORDER — MAGNESIUM HYDROXIDE 400 MG/5ML PO SUSP: 30.0000 mL | Freq: Every day | ORAL | Status: DC | PRN

## 2015-06-14 MED ORDER — LORAZEPAM 1 MG PO TABS
1.0000 mg | ORAL_TABLET | Freq: Three times a day (TID) | ORAL | Status: AC
  Administered 2015-06-16 (×3): 1 mg via ORAL
  Filled 2015-06-14 (×3): qty 1

## 2015-06-14 MED ORDER — LORAZEPAM 1 MG PO TABS
ORAL_TABLET | ORAL | Status: AC
  Filled 2015-06-14: qty 1

## 2015-06-14 MED ORDER — THIAMINE HCL 100 MG/ML IJ SOLN: 100.0000 mg | Freq: Every day | INTRAMUSCULAR | Status: DC

## 2015-06-14 MED ORDER — NICOTINE 21 MG/24HR TD PT24
21.0000 mg | MEDICATED_PATCH | Freq: Every day | TRANSDERMAL | Status: DC
  Administered 2015-06-14: 21 mg via TRANSDERMAL
  Filled 2015-06-14: qty 1

## 2015-06-14 MED ORDER — HYDROXYZINE HCL 25 MG PO TABS
25.0000 mg | ORAL_TABLET | Freq: Four times a day (QID) | ORAL | Status: AC | PRN
  Administered 2015-06-15 – 2015-06-17 (×3): 25 mg via ORAL
  Filled 2015-06-14 (×4): qty 1

## 2015-06-14 MED ORDER — TRAZODONE HCL 50 MG PO TABS
50.0000 mg | ORAL_TABLET | Freq: Every evening | ORAL | Status: DC | PRN
  Administered 2015-06-14 – 2015-06-17 (×3): 50 mg via ORAL
  Filled 2015-06-14 (×3): qty 1

## 2015-06-14 MED ORDER — LORAZEPAM 1 MG PO TABS: 0.0000 mg | ORAL_TABLET | Freq: Four times a day (QID) | ORAL | Status: DC

## 2015-06-14 NOTE — Progress Notes (Signed)
Patient did attend the evening speaker AA meeting.  

## 2015-06-14 NOTE — ED Notes (Signed)
Pt here for increased ETOH consumption and increased depression; pt last drank last night; pt admits to marijuana; pt sts increased depression but denies SI/HI

## 2015-06-14 NOTE — ED Notes (Signed)
Pt now sts "he will run out in front of traffic"; SI precautions initiated

## 2015-06-14 NOTE — Progress Notes (Signed)
D.  Pt pleasant on approach, no complaints voiced at this time . Was reported earlier that Pt had had some diarrhea but Pt denies need for any medication.  Denies SI/HI/hallucinations at this time.  Had visit with wife earlier.  Positive for evening AA group, interacting appropriately with peers on the unit.  Minimal withdrawal symptoms at this time.  A.  Support and encouragement offered  R.  Pt remains safe on unit, will continue to monitor.

## 2015-06-14 NOTE — ED Notes (Signed)
Pt at current time expressing wishes to "walk out in front of a car on [address] if I don't get any help here." [facility] and requesting TTS to be completed.

## 2015-06-14 NOTE — BH Assessment (Addendum)
Tele Assessment Note   Todd Friedman is an 54 y.o. male. [facility]. Pt is currently reporting SI with a plan to walk in front of traffic if he does not receive treatment. Pt denies HI. Pt denies AVH. According to the Pt, he drinks a 5th of liquor a days plus (some) beers. Pt admits to daily marijuana use. Cocaine was found in the Pt's system. Pt denies cocaine use. Pt denies previous mental health treatment. Pt denies current medication. Pt states that he was in prison for 22 years. Pt states that he began drinking at 54 y.o.and has only been sober for the 22 years he was incarcerated. Pt states that he previously tried to walk in front of traffic last year. Pt was not hospitalized. Pt reports family support from his wife.  Writer consulted with Renata Capriceonrad, DNP. Per Renata Capriceonrad Pt meets inpatient criteria. TTS to seek placement.   Axis I: Substance Induced Mood Disorder Axis II: Deferred Axis III:  Past Medical History  Diagnosis Date  . Hypertension   . High cholesterol   . Abdominal cramping, generalized   . IBS (irritable bowel syndrome)   . Irritable bowel syndrome (IBS)   . Alcoholism    Axis IV: other psychosocial or environmental problems and problems with access to health care services Axis V: 41-50 serious symptoms  Past Medical History:  Past Medical History  Diagnosis Date  . Hypertension   . High cholesterol   . Abdominal cramping, generalized   . IBS (irritable bowel syndrome)   . Irritable bowel syndrome (IBS)   . Alcoholism     History reviewed. No pertinent past surgical history.  Family History:  Family History  Problem Relation Age of Onset  . Cancer Mother   . Hypertension Mother   . Leukemia Father   . Leukemia Other     Social History:  reports that he has been smoking Cigarettes.  He has been smoking about 0.50 packs per day. He does not have any smokeless tobacco history on file. He reports that he drinks alcohol. He  reports that he uses illicit drugs (Marijuana).  Additional Social History:  Alcohol / Drug Use Pain Medications: Pt denies Prescriptions: Pt denies Over the Counter: Pt denies History of alcohol / drug use?: Yes Longest period of sobriety (when/how long): 20 years Negative Consequences of Use: Financial, Legal, Personal relationships, Work / School Substance #1 Name of Substance 1: Alcohol 1 - Age of First Use: 11 1 - Amount (size/oz): 5th of liquor and beers 1 - Frequency: daily  1 - Duration: ongoing 1 - Last Use / Amount: 06/14/15 Substance #2 Name of Substance 2: Marijuana 2 - Age of First Use: 11 2 - Amount (size/oz): Unknown 2 - Frequency: daily 2 - Duration: ongoing 2 - Last Use / Amount: 06/14/15  CIWA: CIWA-Ar BP: (!) 156/109 mmHg Pulse Rate: 101 Nausea and Vomiting: no nausea and no vomiting Tactile Disturbances: none Tremor: two Auditory Disturbances: not present Paroxysmal Sweats: beads of sweat obvious on forehead Visual Disturbances: not present Anxiety: mildly anxious Headache, Fullness in Head: mild Agitation: normal activity Orientation and Clouding of Sensorium: oriented and can do serial additions CIWA-Ar Total: 9 COWS:    PATIENT STRENGTHS: (choose at least two) Capable of independent living Communication skills  Allergies: No Known Allergies  Home Medications:  (Not in a hospital admission)  OB/GYN Status:  No LMP for male patient.  General Assessment Data [facility]  Assessment: In system Is this a Tele or Face-to-Face Assessment?: Tele Assessment Is this an Initial Assessment or a Re-assessment for this encounter?: Initial Assessment Marital status: Married [place] name: NA Is patient pregnant?: No Pregnancy Status: No Living Arrangements: Non-relatives/Friends Can pt return to current living arrangement?: No Admission Status: Voluntary Is patient capable of signing voluntary admission?: Yes Referral Source:  Self/Family/Friend Insurance type: [employer] healthcare     Crisis Care Plan Living Arrangements: Non-relatives/Friends Name of Psychiatrist: NA Name of Therapist: NA  Education Status Is patient currently in school?: No Current Grade: NA Highest grade of school patient has completed: Some college Name of school: NA Contact person: NA  Risk to self with the past 6 months Suicidal Ideation: No Has patient been a risk to self within the past 6 months prior to admission? : No Suicidal Intent: No Has patient had any suicidal intent within the past 6 months prior to admission? : No Is patient at risk for suicide?: Yes Suicidal Plan?: Yes-Currently Present Has patient had any suicidal plan within the past 6 months prior to admission? : Yes Specify Current Suicidal Plan: To walk in front traffic Access to Means: No What has been your use of drugs/alcohol within the last 12 months?: Alcohol and marijuana Previous Attempts/Gestures: No How many times?: 1 Other Self Harm Risks: NA Triggers for Past Attempts: None known Intentional Self Injurious Behavior: None Family Suicide History: No Recent stressful life event(s): Other (Comment) Persecutory voices/beliefs?: No Depression: Yes Depression Symptoms: Loss of interest in usual pleasures, Feeling worthless/self pity, Feeling angry/irritable Substance abuse history and/or treatment for substance abuse?: Yes Suicide prevention information given to non-admitted patients: Not applicable  Risk to Others within the past 6 months Homicidal Ideation: No Does patient have any lifetime risk of violence toward others beyond the six months prior to admission? : No Thoughts of Harm to Others: No Current Homicidal Intent: No Current Homicidal Plan: No Access to Homicidal Means: No Identified Victim: NA History of harm to others?: No Assessment of Violence: None Noted Violent Behavior Description: NA Does patient have access to weapons?:  No Criminal Charges Pending?: No Does patient have a court date: No Is patient on probation?: No  Psychosis Hallucinations: None noted Delusions: None noted  Mental Status Report Appearance/Hygiene: Unremarkable Eye Contact: Fair Motor Activity: Freedom of movement Speech: Logical/coherent Level of Consciousness: Alert Mood: Depressed, Sad Affect: Appropriate to circumstance Anxiety Level: None Thought Processes: Coherent, Relevant Judgement: Unimpaired Orientation: Person, Place, Time, Situation, Appropriate for developmental age Obsessive Compulsive Thoughts/Behaviors: None  Cognitive Functioning Concentration: Normal Memory: Recent Intact, Remote Intact IQ: Average Insight: Fair Impulse Control: Fair Appetite: Poor Weight Loss: 0 Weight Gain: 0 Sleep: Decreased Total Hours of Sleep: 4 Vegetative Symptoms: None  ADLScreening [facility] Assessment Services) Patient's cognitive ability adequate to safely complete daily activities?: Yes Patient able to express need for assistance with ADLs?: Yes Independently performs ADLs?: Yes (appropriate for developmental age)  Prior Inpatient Therapy Prior Inpatient Therapy: No Prior Therapy Dates: NA Prior Therapy Facilty/Provider(s): na Reason for Treatment: NA  Prior Outpatient Therapy Prior Outpatient Therapy: No Prior Therapy Dates: NA Prior Therapy Facilty/Provider(s): NA Reason for Treatment: NA Does patient have an ACCT team?: No Does patient have Intensive In-House Services?  : No Does patient have [facility] services? : No Does patient have P4CC services?: No  ADL Screening (condition at time of admission) Patient's cognitive ability adequate to safely complete daily activities?: Yes Is the patient deaf or have difficulty hearing?: No Does  the patient have difficulty seeing, even when wearing glasses/contacts?: No Does the patient have difficulty concentrating, remembering, or making decisions?: No Patient able to  express need for assistance with ADLs?: Yes Does the patient have difficulty dressing or bathing?: No Independently performs ADLs?: Yes (appropriate for developmental age) Does the patient have difficulty walking or climbing stairs?: No Weakness of Legs: None Weakness of Arms/Hands: None       Abuse/Neglect Assessment (Assessment to be complete while patient is alone) Physical Abuse: Denies Verbal Abuse: Denies Sexual Abuse: Denies Exploitation of patient/patient's resources: Denies Self-Neglect: Denies Values / Beliefs Cultural Requests During Hospitalization: None Spiritual Requests During Hospitalization: None   Advance Directives (For Healthcare) Does patient have an advance directive?: No Would patient like information on creating an advanced directive?: No - patient declined information    Additional Information 1:1 In Past 12 Months?: No CIRT Risk: No Elopement Risk: No Does patient have medical clearance?: Yes     Disposition:  Disposition Initial Assessment Completed for this Encounter: Yes Disposition of Patient: Other dispositions Other disposition(s): Other (Comment)  Shonnie Poudrier D 06/14/2015 10:41 AM

## 2015-06-14 NOTE — ED Provider Notes (Signed)
CSN: [id number]     Arrival date & time 06/14/15  [id number] History   First MD Initiated Contact with Patient 06/14/15 [phone]     Chief Complaint  Patient presents with  . Abdominal Pain  . Addiction Problem     (Consider location/radiation/quality/duration/timing/severity/associated sxs/prior Treatment) Patient is a 54 y.o. male presenting with abdominal pain. The history is provided by the patient and medical records.  Abdominal Pain   This is a 54 year old male with past medical history significant for hypertension, hyperlipidemia, IBS, alcoholism, presenting to the ED for abdominal pain and continued alcohol use. Patient states he has been drinking to excess daily for the past month. Patient states he generally drinks beer and liquor. He also admits to smoking marijuana. He states he spent [place] years and [employer] [place] and has had trouble adjusting to life out of prison. He states he does not feel he can find his place in society. He does not feel that he has anything to live for and feels depressed and helpless. He denies any actual intent to harm himself or to harm others. He denies any auditory or visual hallucinations. Patient was seen here approximately 10 days ago and referred to outpatient resources, was not able to find any facility that could help him. He states he did complete inpatient detox just over a year ago at behavioral health, he states this helped him significantly.  He also reports some lower abdominal pain, described as a cramping sensation. He denies any nausea, vomiting, or diarrhea. No fever or chills. Urinary symptoms. Patient does admit he has not eaten a regular meal and several days.  Wife at bedside, admits she fully supports his need for detox and is concerned about his overall well being.  Past Medical History  Diagnosis Date  . Hypertension   . High cholesterol   . Abdominal cramping, generalized   . IBS (irritable bowel syndrome)   . Irritable bowel syndrome (IBS)    . Alcoholism    History reviewed. No pertinent past surgical history. Family History  Problem Relation Age of Onset  . Cancer Mother   . Hypertension Mother   . Leukemia Father   . Leukemia Other    History  Substance Use Topics  . Smoking status: Current Every Day Smoker -- 0.50 packs/day    Types: Cigarettes  . Smokeless tobacco: Not on file  . Alcohol Use: Yes     Comment: "Lots of Liquor"    Review of Systems  Gastrointestinal: Positive for abdominal pain.  Psychiatric/Behavioral:       Substance abuse  All other systems reviewed and are negative.     Allergies  Review of patient's allergies indicates no known allergies.  Home Medications   Prior to Admission medications   Medication Sig Start Date End Date Taking? Authorizing Provider  chlordiazePOXIDE (LIBRIUM) 25 MG capsule 50mg  PO TID x 1D, then 25-50mg  PO BID X 1D, then 25-50mg  PO QD X 1D 06/04/15   Roxy Horsemanobert Browning, PA-C  dicyclomine (BENTYL) 10 MG capsule Take 1 capsule (10 mg total) by mouth 4 (four) times daily -  before meals and at bedtime. For abdominal spasms 09/19/14   Adonis BrookSheila Agustin, NP  hydrochlorothiazide (HYDRODIURIL) 25 MG tablet Take 1 tablet (25 mg total) by mouth daily. For hypertension 09/19/14   Adonis BrookSheila Agustin, NP  hydrOXYzine (ATARAX/VISTARIL) 25 MG tablet Take 1 tablet (25 mg total) by mouth every 6 (six) hours as needed (anxiety/agitation or CIWA < or = 10). For anxiety 09/19/14  Adonis Brook, NP  traZODone (DESYREL) 100 MG tablet Take 1 tablet (100 mg total) by mouth at bedtime as needed for sleep. For sleeplessness 09/19/14   Adonis Brook, NP   BP 164/99 mmHg  Pulse 108  Temp(Src) 97.9 F (36.6 C)  Resp 16  SpO2 96%   Physical Exam  Constitutional: He is oriented to person, place, and time. He appears well-developed and well-nourished. No distress.  HENT:  Head: Normocephalic and atraumatic.  Mouth/Throat: Oropharynx is clear and moist.  Eyes: Conjunctivae and EOM are normal.  Pupils are equal, round, and reactive to light.  Neck: Normal range of motion. Neck supple.  Cardiovascular: Normal rate, regular rhythm and normal heart sounds.   Pulmonary/Chest: Effort normal and breath sounds normal. No respiratory distress. He has no wheezes.  Abdominal: Soft. Bowel sounds are normal. There is no tenderness. There is no guarding.  Musculoskeletal: Normal range of motion. He exhibits no edema.  Neurological: He is alert and oriented to person, place, and time.  Skin: Skin is warm and dry. He is not diaphoretic.  Psychiatric: He has a normal mood and affect.  Nursing note and vitals reviewed.   ED Course  Procedures (including critical care time) Labs Review Labs Reviewed  COMPREHENSIVE METABOLIC PANEL - Abnormal; Notable for the following:    Glucose, Bld 156 (*)    BUN <5 (*)    AST 85 (*)    ALT 66 (*)    All other components within normal limits  URINALYSIS, ROUTINE W REFLEX MICROSCOPIC (NOT AT [facility]) - Abnormal; Notable for the following:    Color, Urine AMBER (*)    Hgb urine dipstick MODERATE (*)    Bilirubin Urine SMALL (*)    Ketones, ur 15 (*)    Protein, ur 100 (*)    All other components within normal limits  URINE RAPID DRUG SCREEN, HOSP PERFORMED - Abnormal; Notable for the following:    Cocaine POSITIVE (*)    Tetrahydrocannabinol POSITIVE (*)    All other components within normal limits  ETHANOL - Abnormal; Notable for the following:    Alcohol, Ethyl (B) 177 (*)    All other components within normal limits  ACETAMINOPHEN LEVEL - Abnormal; Notable for the following:    Acetaminophen (Tylenol), Serum <10 (*)    All other components within normal limits  URINE MICROSCOPIC-ADD ON - Abnormal; Notable for the following:    Squamous Epithelial / LPF FEW (*)    All other components within normal limits  CBC WITH DIFFERENTIAL/PLATELET  LIPASE, BLOOD  SALICYLATE LEVEL    Imaging Review No results found.   EKG Interpretation None       MDM   Final diagnoses:  Uncomplicated alcohol dependence  Substance induced mood disorder   54 year old male here requesting detox from alcohol. Daily drinking for the past month. Admits to marijuana use. Denies any suicidal or homicidal ideation, states he just feels helpless. He denies any auditory or visual hallucinations. Labwork as above, UDS positive for cocaine and THC. He does admit to abdominal pain-- exam here is benign.  Known hx of IBS, likely aggravated from his recent alcohol binge.  Doubt acute/surgical abdomen at this time.  Patient medically cleared.  Awaiting TTS consult.  9:59 AM Initially plan was for TTS to speak with wife and patient regarding options for inpatient detox given he has private insurance. Upon Brandi speaking with patient, he now expresses that he is suicidal and will run out in front of car  on [address]. Telepsych assessment will be completed.  Patient has been accepted to [facility] for IP treatment under care of Dr. Dub Mikes.  EMTALA completed, patient transferred over by [facility].  Garlon Hatchet, PA-C 06/14/15 1347  Garlon Hatchet, PA-C 06/14/15 1347  Blake Divine, MD 06/16/15 [phone]

## 2015-06-14 NOTE — ED Notes (Signed)
Pt placed in burgundy scrubs and security notified to come wand patient.
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
## 2015-06-14 NOTE — Tx Team (Signed)
Initial Interdisciplinary Treatment Plan   PATIENT STRESSORS: Financial difficulties Marital or family conflict Substance abuse   PATIENT STRENGTHS: Average or above average intelligence Motivation for treatment/growth Supportive family/friends   PROBLEM LIST: Problem List/Patient Goals Date to be addressed Date deferred Reason deferred Estimated date of resolution  Substance abuse 06/14/2015     Suicidal Ideation 06/14/2015                                                DISCHARGE CRITERIA:  Ability to meet basic life and health needs Improved stabilization in mood, thinking, and/or behavior Motivation to continue treatment in a less acute level of care Withdrawal symptoms are absent or subacute and managed without 24-hour nursing intervention  PRELIMINARY DISCHARGE PLAN: Attend 12-step recovery group Return to previous living arrangement  PATIENT/FAMIILY INVOLVEMENT: This treatment plan has been presented to and reviewed with the patient, Todd Friedman.  The patient and family have been given the opportunity to ask questions and make suggestions.  Cranford MonBeaudry, Thayden Lemire Evans 06/14/2015, 6:28 PM

## 2015-06-15 ENCOUNTER — Encounter (HOSPITAL_COMMUNITY): Payer: Self-pay | Admitting: Psychiatry

## 2015-06-15 DIAGNOSIS — F1414 Cocaine abuse with cocaine-induced mood disorder: Secondary | ICD-10-CM | POA: Diagnosis present

## 2015-06-15 DIAGNOSIS — F10230 Alcohol dependence with withdrawal, uncomplicated: Secondary | ICD-10-CM

## 2015-06-15 MED ORDER — NICOTINE 21 MG/24HR TD PT24
MEDICATED_PATCH | TRANSDERMAL | Status: AC
  Administered 2015-06-15: 21 mg via TRANSDERMAL
  Filled 2015-06-15: qty 1

## 2015-06-15 MED ORDER — HYDROCHLOROTHIAZIDE 12.5 MG PO CAPS
12.5000 mg | ORAL_CAPSULE | Freq: Every day | ORAL | Status: DC
  Administered 2015-06-15 – 2015-06-16 (×2): 12.5 mg via ORAL
  Filled 2015-06-15 (×4): qty 1

## 2015-06-15 MED ORDER — NICOTINE 21 MG/24HR TD PT24
21.0000 mg | MEDICATED_PATCH | Freq: Every day | TRANSDERMAL | Status: DC
  Administered 2015-06-16 – 2015-06-18 (×3): 21 mg via TRANSDERMAL
  Filled 2015-06-15 (×7): qty 1

## 2015-06-15 MED ORDER — MIRTAZAPINE 15 MG PO TABS
15.0000 mg | ORAL_TABLET | Freq: Every day | ORAL | Status: DC
  Administered 2015-06-15 – 2015-06-17 (×3): 15 mg via ORAL
  Filled 2015-06-15 (×5): qty 1

## 2015-06-15 MED ORDER — PROPRANOLOL HCL 10 MG PO TABS
10.0000 mg | ORAL_TABLET | Freq: Three times a day (TID) | ORAL | Status: DC
  Administered 2015-06-15 – 2015-06-18 (×8): 10 mg via ORAL
  Filled 2015-06-15 (×15): qty 1
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
## 2015-06-15 NOTE — H&P (Signed)
Psychiatric Admission Assessment Adult  Patient Identification: Todd Friedman  MRN:  [id number]  Date of Evaluation:  06/15/2015  Chief Complaint:  MDD SUBSTANCE ABUSE  Principal Diagnosis: Alcohol use disorder,   Diagnosis:   Patient Active Problem List   Diagnosis Date Noted  . Alcohol abuse [F10.10] 06/14/2015  . Alcohol dependence [F10.20] 09/18/2014  . Substance induced mood disorder [F19.94] 09/18/2014   History of Present Illness: Todd Friedman is a 54 year old AA male. Admitted from the [facility] ED with complaint of excessive alcohol consumption. His UDS test results was positive for cocaine & THC, BAL at 177 per toxicology tests results. He reports, "My wife took took me to the hospital on Sunday. I have been drinking excessively, up to a quart of liquor & beer daily x 6 months. I have been drinking so much & for so long that I could no longer get out of bed. When I tried to get out of bed, I stumble against the walls. I drink to deal with everyday stress. The everyday stress an ordinary person can handle is very difficult for me. I did 24 years in prison for murdering my friend, got out in 2010. It is one thing being prison, it is much difficult coming out & have to deal with the rest of the world. I had to face life outside the prison gate since 2010, it has not been fun or easy. When one is in prison, the whole world moved on while the prisoner is stuck witht the little knowledge he had under his bed when entering the prison. No one prepares anyone on how to face the real ward after serving time in prison. That is what I had to learn to do, but it has not been easy trying to deal with work, paying bills, family etc. I'm not depressed. I have problems sleeping, no appetite & I have bad anxiety & IBS. I don't hear voices, no paranoia or delusional thoughts. I drink to deal with life & it's stress".  Elements:  Location:  Alcohol use disorder, severe, dependence. Quality:   Excessive alcohol consumption, high anxiety levels, stressed, . Severity:  Severe, drinking up to a quart liquor daily. Timing:  Cureent. Duration:  Chronic alcoholism x 6 months. Context:  Spent 24 years in prison, got out in 2010, since then, feeling stressed about everything, got to drinking a lot to cope".  Associated Signs/Symptoms:  Depression Symptoms:  insomnia, anxiety, decreased appetite,  (Hypo) Manic Symptoms:  Denies any symptoms  Anxiety Symptoms:  Excessive Worry, high anxiety levels  Psychotic Symptoms:  Denies  PTSD Symptoms: Re-experiencing:  Flashbacks (Killed a friend & went to prison for 24 year"  Total Time spent with patient: 1 hour  Past Medical History:  Past Medical History  Diagnosis Date  . Hypertension   . High cholesterol   . Abdominal cramping, generalized   . IBS (irritable bowel syndrome)   . Irritable bowel syndrome (IBS)   . Alcoholism    History reviewed. No pertinent past surgical history. Family History:  Family History  Problem Relation Age of Onset  . Cancer Mother   . Hypertension Mother   . Leukemia Father   . Leukemia Other    Social History:  History  Alcohol Use  . Yes    Comment: "Lots of Liquor"     History  Drug Use  . Yes  . Special: Marijuana    History   Social History  . Marital Status: Married  Spouse Name: N/A  . Number of Children: N/A  . Years of Education: N/A   Social History Main Topics  . Smoking status: Current Every Day Smoker -- 0.50 packs/day    Types: Cigarettes  . Smokeless tobacco: Not on file  . Alcohol Use: Yes     Comment: "Lots of Liquor"  . Drug Use: Yes    Special: Marijuana  . Sexual Activity: Not on file   Other Topics Concern  . None   Social History Narrative   Additional Social History:   Musculoskeletal: Strength & Muscle Tone: within normal limits Gait & Station: normal Patient leans: N/A  Psychiatric Specialty Exam: Physical Exam  Constitutional: He  is oriented to person, place, and time. He appears well-developed.  HENT:  Head: Normocephalic.  Eyes: Pupils are equal, round, and reactive to light.  Neck: Normal range of motion.  Cardiovascular:  Elevated bp   Respiratory: Effort normal.  GI: Soft.  Genitourinary:  Denies any issues in this areas  Musculoskeletal: Normal range of motion.  Neurological: He is alert and oriented to person, place, and time.  Skin: Skin is warm and dry.  Psychiatric: His speech is normal and behavior is normal. Thought content normal. His mood appears anxious. His affect is not angry, not blunt, not labile and not inappropriate. Cognition and memory are normal. He expresses impulsivity. He exhibits a depressed mood.    Review of Systems  Constitutional: Positive for chills, malaise/fatigue and diaphoresis.  HENT: Negative.   Eyes: Negative.   Respiratory: Negative.   Cardiovascular: Negative.   Gastrointestinal: Positive for nausea and abdominal pain.  Genitourinary: Negative.   Musculoskeletal: Negative.   Skin: Negative.   Neurological: Positive for dizziness and weakness.  Endo/Heme/Allergies: Negative.   Psychiatric/Behavioral: Positive for substance abuse (Alcohol/THC dependence). Negative for depression, suicidal ideas, hallucinations and memory loss. The patient is nervous/anxious and has insomnia.     Blood pressure 145/104, pulse 126, temperature 98.5 F (36.9 C), temperature source Oral, resp. rate 24, height '5\' 8"'  (1.727 m), weight 61.236 kg (135 lb).Body mass index is 20.53 kg/(m^2).  General Appearance: Casual  Eye Contact::  Good  Speech:  Clear and Coherent  Volume:  Normal  Mood:  Tearful, remorseful, however, denies feeling depressed  Affect:  Flat and Tearful  Thought Process:  Intact and clear  Orientation:  Full (Time, Place, and Person)  Thought Content:  Rumination and denies any hallucinations  Suicidal Thoughts:  No  Homicidal Thoughts:  No  Memory:  Immediate;    Good Recent;   Good Remote;   Good  Judgement:  Intact  Insight:  Present  Psychomotor Activity:  Normal  Concentration:  Good  Recall:  Good  Fund of Knowledge:Good  Language: Good  Akathisia:  No  Handed:  Right  AIMS (if indicated):     Assets:  Communication Skills Desire for Improvement  ADL's:  Intact  Cognition: WNL  Sleep:      Risk to Self: Is patient at risk for suicide?: Yes Risk to Others: No Prior Inpatient Therapy: Yes Prior Outpatient Therapy: Yes  Alcohol Screening: 1. How often do you have a drink containing alcohol?: 4 or more times a week 2. How many drinks containing alcohol do you have on a typical day when you are drinking?: 10 or more 3. How often do you have six or more drinks on one occasion?: Daily or almost daily Preliminary Score: 8 4. How often during the last year have you found that  you were not able to stop drinking once you had started?: Daily or almost daily 5. How often during the last year have you failed to do what was normally expected from you becasue of drinking?: Daily or almost daily 6. How often during the last year have you needed a first drink in the morning to get yourself going after a heavy drinking session?: Daily or almost daily 7. How often during the last year have you had a feeling of guilt of remorse after drinking?: Daily or almost daily 8. How often during the last year have you been unable to remember what happened the night before because you had been drinking?: Daily or almost daily 9. Have you or someone else been injured as a result of your drinking?: No 10. Has a relative or friend or a doctor or another health worker been concerned about your drinking or suggested you cut down?: Yes, during the last year Alcohol Use Disorder Identification Test Final Score (AUDIT): 36 Brief Intervention: MD notified of score 20 or above  Allergies:  No Known Allergies  Lab Results:  Results for orders placed or performed during  the hospital encounter of 06/14/15 (from the past 48 hour(s))  Urinalysis, Routine w reflex microscopic (not at [facility])     Status: Abnormal   Collection Time: 06/14/15  7:33 AM  Result Value Ref Range   Color, Urine AMBER (A) YELLOW    Comment: BIOCHEMICALS MAY BE AFFECTED BY COLOR   APPearance CLEAR CLEAR   Specific Gravity, Urine 1.024 1.005 - 1.030   pH 6.0 5.0 - 8.0   Glucose, UA NEGATIVE NEGATIVE mg/dL   Hgb urine dipstick MODERATE (A) NEGATIVE   Bilirubin Urine SMALL (A) NEGATIVE   Ketones, ur 15 (A) NEGATIVE mg/dL   Protein, ur 100 (A) NEGATIVE mg/dL   Urobilinogen, UA 1.0 0.0 - 1.0 mg/dL   Nitrite NEGATIVE NEGATIVE   Leukocytes, UA NEGATIVE NEGATIVE  Urine rapid drug screen (hosp performed)     Status: Abnormal   Collection Time: 06/14/15  7:33 AM  Result Value Ref Range   Opiates NONE DETECTED NONE DETECTED   Cocaine POSITIVE (A) NONE DETECTED   Benzodiazepines NONE DETECTED NONE DETECTED   Amphetamines NONE DETECTED NONE DETECTED   Tetrahydrocannabinol POSITIVE (A) NONE DETECTED   Barbiturates NONE DETECTED NONE DETECTED    Comment:        DRUG SCREEN FOR MEDICAL PURPOSES ONLY.  IF CONFIRMATION IS NEEDED FOR ANY PURPOSE, NOTIFY LAB WITHIN 5 DAYS.        LOWEST DETECTABLE LIMITS FOR URINE DRUG SCREEN Drug Class       Cutoff (ng/mL) Amphetamine      1000 Barbiturate      200 Benzodiazepine   485 Tricyclics       462 Opiates          300 Cocaine          300 THC              50   Urine microscopic-add on     Status: Abnormal   Collection Time: 06/14/15  7:33 AM  Result Value Ref Range   Squamous Epithelial / LPF FEW (A) RARE   RBC / HPF 21-50 <3 RBC/hpf  CBC with Differential     Status: None   Collection Time: 06/14/15  7:56 AM  Result Value Ref Range   WBC 7.3 4.0 - 10.5 K/uL   RBC 4.69 4.22 - 5.81 MIL/uL   Hemoglobin 14.8  13.0 - 17.0 g/dL   HCT 42.7 39.0 - 52.0 %   MCV 91.0 78.0 - 100.0 fL   MCH 31.6 26.0 - 34.0 pg   MCHC 34.7 30.0 - 36.0 g/dL   RDW  14.5 11.5 - 15.5 %   Platelets 219 150 - 400 K/uL   Neutrophils Relative % 73 43 - 77 %   Neutro Abs 5.3 1.7 - 7.7 K/uL   Lymphocytes Relative 20 12 - 46 %   Lymphs Abs 1.5 0.7 - 4.0 K/uL   Monocytes Relative 6 3 - 12 %   Monocytes Absolute 0.4 0.1 - 1.0 K/uL   Eosinophils Relative 0 0 - 5 %   Eosinophils Absolute 0.0 0.0 - 0.7 K/uL   Basophils Relative 1 0 - 1 %   Basophils Absolute 0.1 0.0 - 0.1 K/uL  Comprehensive metabolic panel     Status: Abnormal   Collection Time: 06/14/15  7:56 AM  Result Value Ref Range   Sodium 140 135 - 145 mmol/L   Potassium 3.6 3.5 - 5.1 mmol/L   Chloride 103 101 - 111 mmol/L   CO2 25 22 - 32 mmol/L   Glucose, Bld 156 (H) 65 - 99 mg/dL   BUN <5 (L) 6 - 20 mg/dL   Creatinine, Ser 0.87 0.61 - 1.24 mg/dL   Calcium 9.5 8.9 - 10.3 mg/dL   Total Protein 6.9 6.5 - 8.1 g/dL   Albumin 4.0 3.5 - 5.0 g/dL   AST 85 (H) 15 - 41 U/L   ALT 66 (H) 17 - 63 U/L   Alkaline Phosphatase 92 38 - 126 U/L   Total Bilirubin 0.7 0.3 - 1.2 mg/dL   GFR calc non Af Amer >60 >60 mL/min   GFR calc Af Amer >60 >60 mL/min    Comment: (NOTE) The eGFR has been calculated using the CKD EPI equation. This calculation has not been validated in all clinical situations. eGFR's persistently <60 mL/min signify possible Chronic Kidney Disease.    Anion gap 12 5 - 15  Lipase, blood     Status: None   Collection Time: 06/14/15  7:56 AM  Result Value Ref Range   Lipase 47 22 - 51 U/L  Ethanol     Status: Abnormal   Collection Time: 06/14/15  7:56 AM  Result Value Ref Range   Alcohol, Ethyl (B) 177 (H) <5 mg/dL    Comment:        LOWEST DETECTABLE LIMIT FOR SERUM ALCOHOL IS 5 mg/dL FOR MEDICAL PURPOSES ONLY   Acetaminophen level     Status: Abnormal   Collection Time: 06/14/15  7:56 AM  Result Value Ref Range   Acetaminophen (Tylenol), Serum <10 (L) 10 - 30 ug/mL    Comment:        THERAPEUTIC CONCENTRATIONS VARY SIGNIFICANTLY. A RANGE OF 10-30 ug/mL MAY BE AN  EFFECTIVE CONCENTRATION FOR MANY PATIENTS. HOWEVER, SOME ARE BEST TREATED AT CONCENTRATIONS OUTSIDE THIS RANGE. ACETAMINOPHEN CONCENTRATIONS >150 ug/mL AT 4 HOURS AFTER INGESTION AND >50 ug/mL AT 12 HOURS AFTER INGESTION ARE OFTEN ASSOCIATED WITH TOXIC REACTIONS.   Salicylate level     Status: None   Collection Time: 06/14/15  7:56 AM  Result Value Ref Range   Salicylate Lvl <8.1 2.8 - 30.0 mg/dL   Current Medications: Current Facility-Administered Medications  Medication Dose Route Frequency Provider Last Rate Last Dose  . acetaminophen (TYLENOL) tablet 650 mg  650 mg Oral Q6H PRN Niel Hummer, NP      .  alum & mag hydroxide-simeth (MAALOX/MYLANTA) 200-200-20 MG/5ML suspension 30 mL  30 mL Oral Q4H PRN Niel Hummer, NP      . feeding supplement (ENSURE ENLIVE) (ENSURE ENLIVE) liquid 237 mL  237 mL Oral BID BM Nicholaus Bloom, MD   237 mL at 06/15/15 1105  . hydrOXYzine (ATARAX/VISTARIL) tablet 25 mg  25 mg Oral Q6H PRN Niel Hummer, NP   25 mg at 06/15/15 0113  . loperamide (IMODIUM) capsule 2-4 mg  2-4 mg Oral PRN Niel Hummer, NP      . LORazepam (ATIVAN) tablet 1 mg  1 mg Oral Q6H PRN Niel Hummer, NP   1 mg at 06/15/15 0114  . LORazepam (ATIVAN) tablet 1 mg  1 mg Oral QID Niel Hummer, NP   1 mg at 06/15/15 0827   Followed by  . [START ON 06/16/2015] LORazepam (ATIVAN) tablet 1 mg  1 mg Oral TID Niel Hummer, NP       Followed by  . [START ON 06/17/2015] LORazepam (ATIVAN) tablet 1 mg  1 mg Oral BID Niel Hummer, NP       Followed by  . [START ON 06/18/2015] LORazepam (ATIVAN) tablet 1 mg  1 mg Oral Daily Niel Hummer, NP      . magnesium hydroxide (MILK OF MAGNESIA) suspension 30 mL  30 mL Oral Daily PRN Niel Hummer, NP      . multivitamin with minerals tablet 1 tablet  1 tablet Oral Daily Niel Hummer, NP   1 tablet at 06/15/15 0827  . ondansetron (ZOFRAN-ODT) disintegrating tablet 4 mg  4 mg Oral Q6H PRN Niel Hummer, NP   4 mg at 06/15/15 0705  . thiamine (VITAMIN  B-1) tablet 100 mg  100 mg Oral Daily Niel Hummer, NP   100 mg at 06/15/15 0827  . traZODone (DESYREL) tablet 50 mg  50 mg Oral QHS PRN Niel Hummer, NP   50 mg at 06/14/15 2204   PTA Medications: No prescriptions prior to admission   Previous Psychotropic Medications: Yes   Substance Abuse History in the last 12 months:  Yes.    Consequences of Substance Abuse: Medical Consequences:  Liver damage, Possible death by overdose Legal Consequences:  Arrests, jail time, Loss of driving privilege. Family Consequences:  Family discord, divorce and or separation.  Results for orders placed or performed during the hospital encounter of 06/14/15 (from the past 72 hour(s))  Urinalysis, Routine w reflex microscopic (not at [facility])     Status: Abnormal   Collection Time: 06/14/15  7:33 AM  Result Value Ref Range   Color, Urine AMBER (A) YELLOW    Comment: BIOCHEMICALS MAY BE AFFECTED BY COLOR   APPearance CLEAR CLEAR   Specific Gravity, Urine 1.024 1.005 - 1.030   pH 6.0 5.0 - 8.0   Glucose, UA NEGATIVE NEGATIVE mg/dL   Hgb urine dipstick MODERATE (A) NEGATIVE   Bilirubin Urine SMALL (A) NEGATIVE   Ketones, ur 15 (A) NEGATIVE mg/dL   Protein, ur 100 (A) NEGATIVE mg/dL   Urobilinogen, UA 1.0 0.0 - 1.0 mg/dL   Nitrite NEGATIVE NEGATIVE   Leukocytes, UA NEGATIVE NEGATIVE  Urine rapid drug screen (hosp performed)     Status: Abnormal   Collection Time: 06/14/15  7:33 AM  Result Value Ref Range   Opiates NONE DETECTED NONE DETECTED   Cocaine POSITIVE (A) NONE DETECTED   Benzodiazepines NONE DETECTED NONE DETECTED   Amphetamines  NONE DETECTED NONE DETECTED   Tetrahydrocannabinol POSITIVE (A) NONE DETECTED   Barbiturates NONE DETECTED NONE DETECTED    Comment:        DRUG SCREEN FOR MEDICAL PURPOSES ONLY.  IF CONFIRMATION IS NEEDED FOR ANY PURPOSE, NOTIFY LAB WITHIN 5 DAYS.        LOWEST DETECTABLE LIMITS FOR URINE DRUG SCREEN Drug Class       Cutoff (ng/mL) Amphetamine       1000 Barbiturate      200 Benzodiazepine   099 Tricyclics       833 Opiates          300 Cocaine          300 THC              50   Urine microscopic-add on     Status: Abnormal   Collection Time: 06/14/15  7:33 AM  Result Value Ref Range   Squamous Epithelial / LPF FEW (A) RARE   RBC / HPF 21-50 <3 RBC/hpf  CBC with Differential     Status: None   Collection Time: 06/14/15  7:56 AM  Result Value Ref Range   WBC 7.3 4.0 - 10.5 K/uL   RBC 4.69 4.22 - 5.81 MIL/uL   Hemoglobin 14.8 13.0 - 17.0 g/dL   HCT 42.7 39.0 - 52.0 %   MCV 91.0 78.0 - 100.0 fL   MCH 31.6 26.0 - 34.0 pg   MCHC 34.7 30.0 - 36.0 g/dL   RDW 14.5 11.5 - 15.5 %   Platelets 219 150 - 400 K/uL   Neutrophils Relative % 73 43 - 77 %   Neutro Abs 5.3 1.7 - 7.7 K/uL   Lymphocytes Relative 20 12 - 46 %   Lymphs Abs 1.5 0.7 - 4.0 K/uL   Monocytes Relative 6 3 - 12 %   Monocytes Absolute 0.4 0.1 - 1.0 K/uL   Eosinophils Relative 0 0 - 5 %   Eosinophils Absolute 0.0 0.0 - 0.7 K/uL   Basophils Relative 1 0 - 1 %   Basophils Absolute 0.1 0.0 - 0.1 K/uL  Comprehensive metabolic panel     Status: Abnormal   Collection Time: 06/14/15  7:56 AM  Result Value Ref Range   Sodium 140 135 - 145 mmol/L   Potassium 3.6 3.5 - 5.1 mmol/L   Chloride 103 101 - 111 mmol/L   CO2 25 22 - 32 mmol/L   Glucose, Bld 156 (H) 65 - 99 mg/dL   BUN <5 (L) 6 - 20 mg/dL   Creatinine, Ser 0.87 0.61 - 1.24 mg/dL   Calcium 9.5 8.9 - 10.3 mg/dL   Total Protein 6.9 6.5 - 8.1 g/dL   Albumin 4.0 3.5 - 5.0 g/dL   AST 85 (H) 15 - 41 U/L   ALT 66 (H) 17 - 63 U/L   Alkaline Phosphatase 92 38 - 126 U/L   Total Bilirubin 0.7 0.3 - 1.2 mg/dL   GFR calc non Af Amer >60 >60 mL/min   GFR calc Af Amer >60 >60 mL/min    Comment: (NOTE) The eGFR has been calculated using the CKD EPI equation. This calculation has not been validated in all clinical situations. eGFR's persistently <60 mL/min signify possible Chronic Kidney Disease.    Anion gap 12 5 - 15   Lipase, blood     Status: None   Collection Time: 06/14/15  7:56 AM  Result Value Ref Range   Lipase 47 22 - 51 U/L  Ethanol     Status: Abnormal   Collection Time: 06/14/15  7:56 AM  Result Value Ref Range   Alcohol, Ethyl (B) 177 (H) <5 mg/dL    Comment:        LOWEST DETECTABLE LIMIT FOR SERUM ALCOHOL IS 5 mg/dL FOR MEDICAL PURPOSES ONLY   Acetaminophen level     Status: Abnormal   Collection Time: 06/14/15  7:56 AM  Result Value Ref Range   Acetaminophen (Tylenol), Serum <10 (L) 10 - 30 ug/mL    Comment:        THERAPEUTIC CONCENTRATIONS VARY SIGNIFICANTLY. A RANGE OF 10-30 ug/mL MAY BE AN EFFECTIVE CONCENTRATION FOR MANY PATIENTS. HOWEVER, SOME ARE BEST TREATED AT CONCENTRATIONS OUTSIDE THIS RANGE. ACETAMINOPHEN CONCENTRATIONS >150 ug/mL AT 4 HOURS AFTER INGESTION AND >50 ug/mL AT 12 HOURS AFTER INGESTION ARE OFTEN ASSOCIATED WITH TOXIC REACTIONS.   Salicylate level     Status: None   Collection Time: 06/14/15  7:56 AM  Result Value Ref Range   Salicylate Lvl <6.9 2.8 - 30.0 mg/dL    Observation Level/Precautions:  15 minute checks  Laboratory:  Per ED; BAL - 177, UDS (+) for cocaine & THC  Psychotherapy: Group sessions   Medications: Ativan detox protocols  Consultations: As needed  Discharge Concerns: Safety, mood stability  Estimated LOS: 3-5 days  Other:     Psychological Evaluations: Yes   Treatment Plan Summary: Daily contact with patient to assess and evaluate symptoms and progress in treatment and Medication management: 1. Admit for crisis management and stabilization, estimated length of stay 3-5 days.  2. Medication management to reduce current symptoms to base line and improve the patient's overall level of functioning; continue Ativan detox protocol already in progress, initiate Remeron 15 mg Q hs for  insomnia, Propranolol 10 mg tid for anxiety/HTN. 3. Treat health problems as indicated; HCTZ 12.5 mg for HTN  4. Develop treatment plan to  decrease risk of relapse upon discharge and the need for readmission.  5. Psycho-social education regarding relapse prevention and self care.  6. Health care follow up as needed for medical problems.  7. Review, reconcile, and reinstate any pertinent home medications for other health issues where appropriate. 8. Call for consults with hospitalist for any additional specialty patient care services as needed.  Medical Decision Making:  New problem, with additional work up planned, Review of Psycho-Social Stressors (1), Review or order clinical lab tests (1), Discuss test with performing physician (1), Review and summation of old records (2), New Problem, with no additional work-up planned (3) and Review of Last Therapy Session (1)  I certify that inpatient services furnished can reasonably be expected to improve the patient's condition.   Encarnacion Slates, PMHNP-BC 7/11/201611:45 AM I personally assessed the patient, reviewed the physical exam and labs and formulated the treatment plan Geralyn Flash A. Sabra Heck, M.D.

## 2015-06-15 NOTE — Progress Notes (Signed)
Patient ID: Todd Friedman, male   DOB: 01/10/1961, 54 y.o.   MRN: [id number]  Pt currently presents with a blunted affect and pleasant behavior. Per self inventory, pt rates depression at a 7, hopelessness 6 and anxiety 6. Pt's daily goal is "whatever it takes" and they intend to do so by "whatever it takes." Pt reports poor sleep, good concentration, low energy and a good appetite. Pt states "i'm feeling so much better today" as he smiles at Clinical research associatewriter.   Pt provided with medications per providers orders. Pt's labs and vitals were monitored throughout the day. Pt supported emotionally and encouraged to express concerns and questions. Pt educated on medications. Pt assessed for signs and symptoms of withdrawal. Md notified of pt's trending hypertension.  Pt's safety ensured with 15 minute and environmental checks. Pt currently denies SI/HI and A/V hallucinations. Pt verbally agrees to seek staff if SI/HI or A/VH occurs and to consult with staff before acting on these thoughts. Will continue POC. Pt placed on anti-hypertensive medications today due to trending hypertension since admission.

## 2015-06-15 NOTE — Progress Notes (Signed)
Recreation Therapy Notes  Date: 07.11.16 Time: 9:30 am Location: [address] Group Room  Group Topic: Stress Management  Goal Area(s) Addresses:  Patient will verbalize importance of using healthy stress management.  Patient will identify positive emotions associated with healthy stress management.   Intervention: Stress Management  Activity :  Progressive Muscle Relaxation.  LRT introduced and educated patients on stress management technique of progressive muscle relaxation.  A script was used to deliver both techniques to patients.  Patients were asked to follow the script read a loud by the LRT to engage in practicing the technique.  Education:  Stress Management, Discharge Planning.   Education Outcome: Acknowledges edcuation/In group clarification offered/Needs additional education  Clinical Observations/Feedback: Patient did not attend group.    Caroll RancherMarjette Avenir Lozinski, LRT/CTRS         Caroll RancherLindsay, Rafay Dahan A 06/15/2015 3:37 PM

## 2015-06-15 NOTE — Progress Notes (Signed)
NUTRITION ASSESSMENT  Pt identified as at risk on the Malnutrition Screen Tool  INTERVENTION: 1. Educated patient on the importance of nutrition and encouraged intake of food and beverages. 2. Discussed weight goals. 3. Supplements: continue Ensure Enlive BID, each supplement provides 350 kcal and 20 grams of protein  NUTRITION DIAGNOSIS: Unintentional weight loss related to sub-optimal intake as evidenced by pt report.   Goal: Pt to meet >/= 90% of their estimated nutrition needs.  Monitor:  PO intake  Assessment:  Pt seen for MST. Pt drank 1/5 liquor/day PTA and last drink was the day of admission. Pt with SI. Per weight hx, pt has lost 30 lbs (18% body weight) in 9 months which is significant for time frame. Experiencing withdrawal from alcohol symptoms.   Ensure has already been ordered BID.  54 y.o. male  Height: Ht Readings from Last 1 Encounters:  06/14/15 5\' 8"  (1.727 m)    Weight: Wt Readings from Last 1 Encounters:  06/14/15 135 lb (61.236 kg)    Weight Hx: Wt Readings from Last 10 Encounters:  06/14/15 135 lb (61.236 kg)  09/17/14 134 lb 12 oz (61.122 kg)  09/16/14 165 lb (74.844 kg)  02/14/13 148 lb (67.132 kg)  04/14/12 160 lb (72.576 kg)    BMI:  Body mass index is 20.53 kg/(m^2). Pt meets criteria for normal weight status. based on current BMI.  Estimated Nutritional Needs: Kcal: 25-30 kcal/kg Protein: > 1 gram protein/kg Fluid: 1 ml/kcal  Diet Order: Diet Heart Room service appropriate?: Yes; Fluid consistency:: Thin Pt is also offered choice of unit snacks mid-morning and mid-afternoon.  Pt is eating as desired.   Lab results and medications reviewed.     Trenton GammonJessica Nakeesha Bowler, RD, LDN Inpatient Clinical Dietitian Pager # [phone] After hours/weekend pager # [phone]

## 2015-06-15 NOTE — Plan of Care (Signed)
Problem: Alteration in mood & ability to function due to Goal: STG-Patient will attend groups Outcome: Progressing Pt attended group

## 2015-06-15 NOTE — [facility] Group Notes (Signed)
   [facility]  06/15/2015  8:45 AM   Participation Quality: Alert, Appropriate and Oriented  Mood/Affect: Depressed and Flat  Depression Rating: 4  Anxiety Rating: 5  Thoughts of Suicide: Pt denies SI/HI  Will you contract for safety? Yes  Current AVH: Pt denies  Plan for Discharge/Comments: Pt attended discharge planning group and actively participated in group. CSW provided pt with today's workbook. Patient hopes to return home to follow up with outpatient services.  Transportation Means: Pt reports access to transportation  Supports: No supports mentioned at this time  Samuella BruinKristin Alexandra Lipps, MSW, [employer]CSWA Clinical Social Worker [facility] [phone]

## 2015-06-15 NOTE — Progress Notes (Signed)
Patient ID: Clint GuyMaurice Alvizo, male   DOB: 12/24/1960, 54 y.o.   MRN: [id number] D: Client reports he's here due to "alcoholism" "I fell back into my old habits a home, started drinking again" "I never did drink in the street, to much going on out there" Reports no diarrhea since Imodium administered on previous shift, "I "I only went twice" A: Writer provided emotional support, encouraged client to report any concerns. Staff will monitor q3815min for safety. R: client is safe on the unit, attended group.

## 2015-06-15 NOTE — [facility] Group Notes (Signed)
[facility] LCSW Group Therapy 06/15/2015  1:15 pm  Type of Therapy: Group Therapy Participation Level: Active  Participation Quality: Attentive, Sharing and Supportive  Affect: Appropriate  Cognitive: Alert and Oriented  Insight: Developing/Improving and Engaged  Engagement in Therapy: Developing/Improving and Engaged  Modes of Intervention: Clarification, Confrontation, Discussion, Education, Exploration,  Limit-setting, Orientation, Problem-solving, Rapport Building, Dance movement psychotherapisteality Testing, Socialization and Support  Summary of Progress/Problems: Pt identified obstacles faced currently and processed barriers involved in overcoming these obstacles. Pt identified steps necessary for overcoming these obstacles and explored motivation (internal and external) for facing these difficulties head on. Pt further identified one area of concern in their lives and chose a goal to focus on for today. Patient identified isolation which triggers his alcohol use as an obstacle and reports that he enjoys being involved with church and volunteering in the past at the [employer]. CSW and other group members provided patient with emotional support an encouragement.  Samuella BruinKristin Audra Bellard, MSW, [employer]CSWA Clinical Social Worker [facility] [phone]
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
## 2015-06-15 NOTE — Progress Notes (Signed)
Adult Psychoeducational Group Note  Date:  06/15/2015 Time:  9:54 PM  Group Topic/Focus:  Wrap-Up Group:   The focus of this group is to help patients review their daily goal of treatment and discuss progress on daily workbooks.  Participation Level:  Active  Participation Quality:  Appropriate and Attentive  Affect:  Appropriate  Cognitive:  Appropriate  Insight: Appropriate  Engagement in Group:  Engaged  Modes of Intervention:  Discussion  Additional Comments:  Pt stated he had a great day today. He received many compliments as he looks much better than when he first arrived. Pt stated he has been having a problem with keeping solid foods down. Pt stated his goal for tomorrow is to smile all day. Pt stated he will accomplish this by going to breakfast, attending groups he is supposed to go to, read, and stay hydrated.  Caswell CorwinOwen, Lourdes Manning C 06/15/2015, 9:54 PM

## 2015-06-15 NOTE — Plan of Care (Signed)
Problem: Alteration in mood & ability to function due to Goal: STG-Pt will be introduced to the 12-step program of recovery (Patient will be introduced to the 12-step program of recovery and disease concept of addiction)  Outcome: Progressing Pt attended AA group this evening

## 2015-06-16 MED ORDER — HYDROCHLOROTHIAZIDE 25 MG PO TABS
25.0000 mg | ORAL_TABLET | Freq: Every day | ORAL | Status: DC
  Administered 2015-06-17 – 2015-06-18 (×2): 25 mg via ORAL
  Filled 2015-06-16 (×4): qty 1

## 2015-06-16 NOTE — Progress Notes (Signed)
Patient ID: Todd Friedman, male   DOB: 02/14/1961, 54 y.o.   MRN: [id number]  Pt currently presents with a blunted affect and euthymic behavior. Per self inventory, pt rates hopelessness at a 4 and anxiety 5. Pt's daily goal is "going home" and they intend to do so by "work this program." Pt reports fair sleep, good concentration, normal energy and a poor appetite. Pt supportive of other pts and shows insight in group participation today. Pt c/o nausea and diarrhea today.   Pt provided with medications per providers orders. Pt's labs and vitals were monitored throughout the day. Pt supported emotionally and encouraged to express concerns and questions. Pt educated on medications.  Pt's safety ensured with 15 minute and environmental checks. Pt currently denies SI/HI and A/V hallucinations. Pt verbally agrees to seek staff if SI/HI or A/VH occurs and to consult with staff before acting on these thoughts. Will continue POC.

## 2015-06-16 NOTE — [facility] Suicide Risk Assessment (Signed)
[facility] INPATIENT:  Family/Significant Other Suicide Prevention Education  Suicide Prevention Education:  Education Completed; Wife Anna GenreFelicia Capell [phone],  (name of family member/significant other) has been identified by the patient as the family member/significant other with whom the patient will be residing, and identified as the person(s) who will aid the patient in the event of a mental health crisis (suicidal ideations/suicide attempt).  With written consent from the patient, the family member/significant other has been provided the following suicide prevention education, prior to the and/or following the discharge of the patient.  The suicide prevention education provided includes the following:  Suicide risk factors  Suicide prevention and interventions  National Suicide Hotline telephone number  [facility] assessment telephone number  [facility] Emergency Assistance 911  [facility] and/or Residential Mobile Crisis Unit telephone number  Request made of family/significant other to:  Remove weapons (e.g., guns, rifles, knives), all items previously/currently identified as safety concern.    Remove drugs/medications (over-the-counter, prescriptions, illicit drugs), all items previously/currently identified as a safety concern.  The family member/significant other verbalizes understanding of the suicide prevention education information provided.  The family member/significant other agrees to remove the items of safety concern listed above.  Stina Gane, [place] CarboKristin L 06/16/2015, 11:04 AM

## 2015-06-16 NOTE — Progress Notes (Signed)
Recreation Therapy Notes  Animal-Assisted Activity (AAA) Program Checklist/Progress Notes Patient Eligibility Criteria Checklist & Daily Group note for Rec Tx Intervention  Date: 07.12.16 Time: 2:45 pm Location: [address] Dayroom   AAA/T Program Assumption of Risk Form signed by Patient/ or Parent Legal Guardian yes  Patient is free of allergies or sever asthma yes  Patient reports no fear of animals yes  Patient reports no history of cruelty to animalsyes  Patient understands his/her participation is voluntary yes  Patient washes hands before animal contact yes  Patient washes hands after animal contact yes  Education: Hand Washing, Appropriate Animal Interaction   Education Outcome: Acknowledges understanding/In group clarification offered/Needs additional education.   Clinical Observations/Feedback:  Patient did not attend group.   Shyquan Stallbaumer, LRT/CTRS         Donavan Kerlin A 06/16/2015 4:03 PM 

## 2015-06-16 NOTE — Progress Notes (Signed)
[facility] MD Progress Note  06/16/2015 7:12 PM Essex Friedman  MRN:  [id number] Subjective:  Gordan states he is getting himself back together. Looking back cant explain why things got to this point for him. He states he wants to move forward. He wants outpatient treatment and states he does not want to lose his job. States at home things are going well. His wife is supportive. He likes work. States it is not as pleasant under 90 degree weather but states he copes. Principal Problem: Alcohol dependence Diagnosis:   Patient Active Problem List   Diagnosis Date Noted  . Cocaine abuse with cocaine-induced mood disorder [F14.14] 06/15/2015  . Alcohol dependence [F10.20] 09/18/2014  . Substance induced mood disorder [F19.94] 09/18/2014   Total Time spent with patient: 30 minutes   Past Medical History:  Past Medical History  Diagnosis Date  . Hypertension   . High cholesterol   . Abdominal cramping, generalized   . IBS (irritable bowel syndrome)   . Irritable bowel syndrome (IBS)   . Alcoholism    History reviewed. No pertinent past surgical history. Family History:  Family History  Problem Relation Age of Onset  . Cancer Mother   . Hypertension Mother   . Leukemia Father   . Leukemia Other    Social History:  History  Alcohol Use  . Yes    Comment: "Lots of Liquor"     History  Drug Use  . Yes  . Special: Marijuana    History   Social History  . Marital Status: Married    Spouse Name: N/A  . Number of Children: N/A  . Years of Education: N/A   Social History Main Topics  . Smoking status: Current Every Day Smoker -- 0.50 packs/day    Types: Cigarettes  . Smokeless tobacco: Not on file  . Alcohol Use: Yes     Comment: "Lots of Liquor"  . Drug Use: Yes    Special: Marijuana  . Sexual Activity: Not on file   Other Topics Concern  . None   Social History Narrative   Additional History:    Sleep: Fair  Appetite:  Fair   Assessment:    Musculoskeletal: Strength & Muscle Tone: within normal limits Gait & Station: normal Patient leans: normal   Psychiatric Specialty Exam: Physical Exam  Review of Systems  Constitutional: Negative.   HENT: Negative.   Eyes: Negative.   Respiratory: Negative.   Cardiovascular: Negative.   Gastrointestinal: Negative.   Genitourinary: Negative.   Musculoskeletal: Negative.   Skin: Negative.   Neurological: Negative.   Endo/Heme/Allergies: Negative.   Psychiatric/Behavioral: Positive for substance abuse. The patient is nervous/anxious and has insomnia.     Blood pressure 131/87, pulse 97, temperature 98.6 F (37 C), temperature source Oral, resp. rate 16, height  (1.727 m), weight 61.236 kg (135 lb).Body mass index is 20.53 kg/(m^2).  General Appearance: Fairly Groomed  Patent attorney::  Fair  Speech:  Clear and Coherent  Volume:  Normal  Mood:  Euthymic  Affect:  Appropriate  Thought Process:  Coherent and Goal Directed  Orientation:  Full (Time, Place, and Person)  Thought Content:  symptoms events worries concerns  Suicidal Thoughts:  No  Homicidal Thoughts:  No  Memory:  Immediate;   Fair Recent;   Fair Remote;   Fair  Judgement:  Fair  Insight:  Present  Psychomotor Activity:  Restlessness  Concentration:  Fair  Recall:  [employer] of Knowledge:Fair  Language: Fair  Akathisia:  No  Handed:  Right  AIMS (if indicated):     Assets:  Desire for Improvement Housing Vocational/Educational  ADL's:  Intact  Cognition: WNL  Sleep:  Number of Hours: 4.5     Current Medications: Current Facility-Administered Medications  Medication Dose Route Frequency Provider Last Rate Last Dose  . acetaminophen (TYLENOL) tablet 650 mg  650 mg Oral Q6H PRN Thermon LeylandLaura A Davis, NP      . alum & mag hydroxide-simeth (MAALOX/MYLANTA) 200-200-20 MG/5ML suspension 30 mL  30 mL Oral Q4H PRN Thermon LeylandLaura A Davis, NP      . feeding supplement (ENSURE ENLIVE) (ENSURE ENLIVE) liquid 237 mL  237  mL Oral BID BM Rachael FeeIrving A Koen Antilla, MD   237 mL at 06/16/15 1525  . [START ON 06/17/2015] hydrochlorothiazide (HYDRODIURIL) tablet 25 mg  25 mg Oral Daily Rachael FeeIrving A Yeraldy Spike, MD      . hydrOXYzine (ATARAX/VISTARIL) tablet 25 mg  25 mg Oral Q6H PRN Thermon LeylandLaura A Davis, NP   25 mg at 06/15/15 0113  . loperamide (IMODIUM) capsule 2-4 mg  2-4 mg Oral PRN Thermon LeylandLaura A Davis, NP   4 mg at 06/15/15 1816  . LORazepam (ATIVAN) tablet 1 mg  1 mg Oral Q6H PRN Thermon LeylandLaura A Davis, NP   1 mg at 06/15/15 0114  . [START ON 06/17/2015] LORazepam (ATIVAN) tablet 1 mg  1 mg Oral BID Thermon LeylandLaura A Davis, NP       Followed by  . [START ON 06/18/2015] LORazepam (ATIVAN) tablet 1 mg  1 mg Oral Daily Thermon LeylandLaura A Davis, NP      . magnesium hydroxide (MILK OF MAGNESIA) suspension 30 mL  30 mL Oral Daily PRN Thermon LeylandLaura A Davis, NP      . mirtazapine (REMERON) tablet 15 mg  15 mg Oral QHS Sanjuana KavaAgnes I Nwoko, NP   15 mg at 06/15/15 2154  . multivitamin with minerals tablet 1 tablet  1 tablet Oral Daily Thermon LeylandLaura A Davis, NP   1 tablet at 06/16/15 0815  . nicotine (NICODERM CQ - dosed in mg/24 hours) patch 21 mg  21 mg Transdermal Daily Rachael FeeIrving A Monya Kozakiewicz, MD   21 mg at 06/16/15 0815  . ondansetron (ZOFRAN-ODT) disintegrating tablet 4 mg  4 mg Oral Q6H PRN Thermon LeylandLaura A Davis, NP   4 mg at 06/16/15 0815  . propranolol (INDERAL) tablet 10 mg  10 mg Oral TID Sanjuana KavaAgnes I Nwoko, NP   10 mg at 06/16/15 1701  . thiamine (VITAMIN B-1) tablet 100 mg  100 mg Oral Daily Thermon LeylandLaura A Davis, NP   100 mg at 06/16/15 0816  . traZODone (DESYREL) tablet 50 mg  50 mg Oral QHS PRN Thermon LeylandLaura A Davis, NP   50 mg at 06/14/15 2204    Lab Results: No results found for this or any previous visit (from the past 48 hour(s)).  Physical Findings: AIMS: Facial and Oral Movements Muscles of Facial Expression: None, normal Lips and Perioral Area: None, normal Jaw: None, normal Tongue: None, normal,Extremity Movements Upper (arms, wrists, hands, fingers): None, normal Lower (legs, knees, ankles, toes): None, normal, Trunk  Movements Neck, shoulders, hips: None, normal, Overall Severity Severity of abnormal movements (highest score from questions above): None, normal Incapacitation due to abnormal movements: None, normal Patient's awareness of abnormal movements (rate only patient's report): No Awareness, Dental Status Current problems with teeth and/or dentures?: Yes Does patient usually wear dentures?: Yes  CIWA:  CIWA-Ar Total: 1 COWS:     Treatment Plan Summary: Daily contact with  patient to assess and evaluate symptoms and progress in treatment and Medication management Supportive approach/coping skills Alcohol dependence; continue the detox/work a relapse prevention plan Cocaine dependence; work a relapse prevention plan Insomnia/depression; will continue the Remeron at 15 mg HS High BP; continue to monitor, increase the HCTZ to 25 mg daily  Medical Decision Making:  Review of Psycho-Social Stressors (1), Review of Medication Regimen & Side Effects (2) and Review of New Medication or Change in Dosage (2)     Juanna Pudlo A 06/16/2015, 7:12 PM

## 2015-06-16 NOTE — Tx Team (Addendum)
Interdisciplinary Treatment Plan Update (Adult) Date: 06/16/2015   Time Reviewed: 9:30 AM  Progress in Treatment: Attending groups: Yes Participating in groups: Yes Taking medication as prescribed: Yes Tolerating medication: Yes Family/Significant other contact made: Yes, CSW has spoken with wife Patient understands diagnosis: Yes Discussing patient identified problems/goals with staff: Yes Medical problems stabilized or resolved: Yes Denies suicidal/homicidal ideation: Yes Issues/concerns per patient self-inventory: Yes Other:  New problem(s) identified: N/A  Discharge Plan or Barriers:   06/16/2015: Patient plans to return home to follow up with IOP services.  Reason for Continuation of Hospitalization:  Depression Anxiety Medication Stabilization   Comments: N/A  Estimated length of stay: 3-5 days  For review of initial/current patient goals, please see plan of care. Patient is a 54 year old African American male admitted for ETOH detox and SI. [place] a friend who he describes as supportive. Patient identifies his goal as to get his life back and in the right direction. Patient plans to return home and follow up with outpatient services. Patient will benefit from crisis stabilization, medication evaluation, group therapy, and psycho education in addition to case management for discharge planning. Patient and CSW reviewed pt's identified goals and treatment plan. Pt verbalized understanding and agreed to treatment plan.  Attendees: Patient:    Family:    Physician: Dr. Jama Flavorsobos; Dr. Dub MikesLugo 06/16/2015 9:30 AM  Nursing: Kathi SimpersSarah Twyman, Manuela SchwartzJennifer Pritchett , RN 06/16/2015 9:30 AM  Clinical Social Worker: Samuella BruinKristin Asucena Galer,  LCSWA 06/16/2015 9:30 AM  Other: Chad CordialLauren Carter, LCSWA 06/16/2015 9:30 AM  Other: Leisa LenzValerie Enoch, Vesta MixerMonarch Liaison 06/16/2015 9:30 AM  Other: Onnie BoerJennifer Clark, Case Manager 06/16/2015 9:30 AM  Other: Serena ColonelAggie Nwoko, NP 06/16/2015 9:30 AM  Other:     Other:    Other:    Other:    Other:     Scribe for Treatment Team:  Samuella BruinKristin Arieh Bogue, MSW, [employer]CS[employer] [phone]

## 2015-06-16 NOTE — Plan of Care (Signed)
Problem: Alteration in mood & ability to function due to Goal: LTG-Pt is able to verbalize triggers for his/her abuse (Patient is able to verbalize triggers for his/her abuse and strategies to maintain sobriety)  Outcome: Progressing Pt states "sponsors are important. AA is something you have to do."

## 2015-06-16 NOTE — Progress Notes (Signed)
The first part of the shift was good for patient.  He denies SI/HI/AVH.  He attended evening group with AA and was engaged.  He voiced no needs or concerns during that time.  At medication time, pt took his scheduled mirtazapine and went to bed.  After a while, pt began having delusions and VH of people at the window, saying they were trying to come into the room.  He was reassured multiple times that he was safe and that no one could come into the building without security letting them come in.  Pt was given Trazodone and Vistaril around 2155.  Pt continues to come out into the hallway talking about people in the window.  Around 2330, pt came out of his room into the hall making a gesture as if he was opening a window saying, "I got to raise 'em up".  Writer took pt 1 mg of Ativan to pt and convinced him to take the medication.  Pt continues to talk about the window, and when writer questioned him, he pointed to the wall saying, "I saw a girl in that window" and was acting as if he was trying to open the window.  Writer turned on the light and showed pt that there was no window beside his bed.  He wanted to know what was on the other side of the wall, and pt was told that the bathroom was on the other side.  Pt just shook his head and lay down on his bed.  The MHT reports that since taking the Ativan, pt has asked her if she can "hear the knocking".  Pt continues to be assured of his safety and supported.  Safety maintained with q15 minute checks.

## 2015-06-16 NOTE — Plan of Care (Signed)
Problem: Diagnosis: Increased Risk For Suicide Attempt Goal: STG-Patient Will Report Suicidal Feelings to Staff Outcome: Progressing Client is safe on the unit, AEB, denying SI and q2115min safety checks.

## 2015-06-16 NOTE — Progress Notes (Signed)
Pt attended the evening AA speaker meeting. Pt was engaged and appropriate. 

## 2015-06-16 NOTE — [facility] Group Notes (Signed)
[facility] LCSW Group Therapy 06/16/2015 1:15 PM Type of Therapy: Group Therapy Participation Level: Active  Participation Quality: Attentive, Sharing and Supportive  Affect: Appropriate  Cognitive: Alert and Oriented  Insight: Developing/Improving and Engaged  Engagement in Therapy: Developing/Improving and Engaged  Modes of Intervention: Activity, Clarification, Confrontation, Discussion, Education, Exploration, Limit-setting, Orientation, Problem-solving, Rapport Building, Dance movement psychotherapisteality Testing, Socialization and Support  Summary of Progress/Problems: Patient was attentive and engaged with speaker from [employer]. Patient was attentive to speaker while they shared their story of dealing with mental health and overcoming it. Patient expressed interest in their programs and services and received information on their agency. Patient processed ways they can relate to the speaker.   Samuella BruinKristin Shadoe Bethel, MSW, [employer]CS[employer] Clinical Social Worker [facility] [phone]

## 2015-06-16 NOTE — Progress Notes (Signed)
Adult Psychoeducational Group Note  Date:  06/16/2015 Time: 09:00am  Group Topic/Focus:  Recovery Goals:   The focus of this group is to identify appropriate goals for recovery and establish a plan to achieve them.  Participation Level:  Active  Participation Quality:  Appropriate, Attentive, Sharing and Supportive  Affect:  Blunted  Cognitive:  Alert and Oriented  Insight: Good  Engagement in Group:  Developing/Improving, Engaged and Supportive  Modes of Intervention:  Discussion, Education, Orientation and Support  Additional Comments:  Pt able to identify one sleep hygiene activity to utilize post discharge (not forcing yourself to sleep). Pt also to identify non-pharmacological coping skill to use in recovery.   Aurora Maskwyman, Rashawn Rayman E 06/16/2015, 10:14 AM

## 2015-06-17 LAB — AMMONIA: AMMONIA: 27 umol/L (ref 9–35)

## 2015-06-17 MED ORDER — BENZTROPINE MESYLATE 1 MG PO TABS
1.0000 mg | ORAL_TABLET | Freq: Two times a day (BID) | ORAL | Status: DC | PRN
  Administered 2015-06-17 (×2): 1 mg via ORAL
  Filled 2015-06-17 (×2): qty 1

## 2015-06-17 MED ORDER — LORAZEPAM 1 MG PO TABS
1.0000 mg | ORAL_TABLET | Freq: Four times a day (QID) | ORAL | Status: AC
  Administered 2015-06-17 (×3): 1 mg via ORAL
  Filled 2015-06-17 (×2): qty 1

## 2015-06-17 MED ORDER — LORAZEPAM 1 MG PO TABS: 1.0000 mg | ORAL_TABLET | Freq: Two times a day (BID) | ORAL | Status: DC

## 2015-06-17 MED ORDER — SODIUM CHLORIDE 0.9 % IV BOLUS (SEPSIS): 1000.0000 mL | Freq: Once | INTRAVENOUS | Status: DC

## 2015-06-17 MED ORDER — HALOPERIDOL 5 MG PO TABS
5.0000 mg | ORAL_TABLET | Freq: Four times a day (QID) | ORAL | Status: DC | PRN
  Administered 2015-06-17 (×2): 5 mg via ORAL
  Filled 2015-06-17 (×2): qty 1

## 2015-06-17 MED ORDER — LORAZEPAM 1 MG PO TABS: 1.0000 mg | ORAL_TABLET | Freq: Every day | ORAL | Status: DC

## 2015-06-17 MED ORDER — LORAZEPAM 1 MG PO TABS: 1.0000 mg | ORAL_TABLET | Freq: Four times a day (QID) | ORAL | Status: DC | PRN

## 2015-06-17 MED ORDER — TRAZODONE HCL 50 MG PO TABS
50.0000 mg | ORAL_TABLET | Freq: Every evening | ORAL | Status: DC | PRN
  Administered 2015-06-17: 50 mg via ORAL

## 2015-06-17 MED ORDER — LORAZEPAM 1 MG PO TABS
2.0000 mg | ORAL_TABLET | Freq: Once | ORAL | Status: AC
  Administered 2015-06-17: 2 mg via ORAL
  Filled 2015-06-17: qty 4

## 2015-06-17 MED ORDER — LORAZEPAM 1 MG PO TABS
1.0000 mg | ORAL_TABLET | Freq: Three times a day (TID) | ORAL | Status: DC
  Administered 2015-06-18: 1 mg via ORAL
  Filled 2015-06-17: qty 1

## 2015-06-17 MED ORDER — HALOPERIDOL 5 MG PO TABS
5.0000 mg | ORAL_TABLET | Freq: Once | ORAL | Status: AC
  Administered 2015-06-17: 5 mg via ORAL
  Filled 2015-06-17 (×2): qty 1

## 2015-06-17 NOTE — Progress Notes (Signed)
1:1 note  Pt remains safe with safety sitter present. He is walking the halls with sitter. He remains calm and cooperative but endorses auditory hallucinations that he says are real.

## 2015-06-17 NOTE — Clinical Social Work Note (Signed)
CSW updated wife Sunny SchleinFelicia [phone] on patient's progress.  Samuella BruinKristin Janyia Guion, MSW, [employer] Clinical Social Worker [facility] [phone]

## 2015-06-17 NOTE — Progress Notes (Signed)
[facility] Pt appears confused and wandering into other pt rooms. Pt was placed on 1-1 at this time due to current behaviors.

## 2015-06-17 NOTE — Clinical Social Work Note (Signed)
CSW met with patient again to inquire about his interest in residential treatment. Patient again verbalized his preference for outpatient services at discharge.  Tilden Fossa, MSW, [place] Worker [facility] [phone]

## 2015-06-17 NOTE — Progress Notes (Signed)
Pt is still awake and cannot seem to relax to go to sleep.  PA was called about pt's bizarre behavior and writer received order to give Ativan 2 mg and Haldol 5 mg once which pt received at 0212.  Pt has not been able to go to sleep tonight even after taking these medications.  Pt has been up and down to the nurse's station.  He rang the call bell a few minutes ago and when staff went to see what he needed, he said that he was just trying to turn on the TV in the ceiling.  He was referring to the vent in the ceiling.  Writer explained that he was looking at a vent not a TV.  Pt is also still talking about his wife and a black camry.  He said he needs to get out and look in the trunk.  Pt's VS were checked and his BP is still elevated and his pulse was 124 sitting and 135 standing.  The PA was again called and notified.  The PA stated to continue monitoring pt for safety.  Pt is currently in his room.

## 2015-06-17 NOTE — Progress Notes (Signed)
1:1 Note  [name] remains safe on the unit with safety sitter present. Pt continues to experience delusions and av hallucinations. Pt remains calm and cooperative, though he requires frequent redirection from Recruitment consultantsafety sitter. He has continued to attempt to enter pt's rooms. Will continue to monitor for needs/safety.

## 2015-06-17 NOTE — Progress Notes (Signed)
1:1 note  Todd Friedman has been calm and appropriate on unit. He still reports hallucinations. At one point he asked this Clinical research associatewriter when we were going to do the "procedure" on him and he got money out of his wallet to pay for an Ensure drink. Will continue to monitor for needs/safety.

## 2015-06-17 NOTE — ED Notes (Signed)
[facility] [place].

## 2015-06-17 NOTE — Progress Notes (Signed)
Pt was back up, insisting that his wife was standing in the parking lot wearing a black coat.  Pt had another RN look out the window, but there was no one standing in the parking lot.  Pt then wanted writer to look, saying that his wife was standing in the parking lot by a black camry.  Again pt was assured that there was no one in the parking lot.  Pt then asked if he could call his wife and that would reassure him that she was fine and that she was not in the parking lot.  The CN agreed that pt could call, and Clinical research associatewriter along with CN took pt to the dayroom phone.  Pt tried to call several times and was not able to dial the phone correctly.  Writer got the cell number for pt's wife which was dialed by the CN and went to her voice mail.  CN then dialed the home number listed in pt's shadow chart.  Wife answered and pt was able to talk with her.  Pt then hung up as his wife was still talking to him.  He returned to his room and got back into bed.  Staff continues to monitor pt q15 minutes and if pt continues with his paranoia, will call PA for additional orders.

## 2015-06-17 NOTE — [facility] Group Notes (Signed)
[facility] LCSW Group Therapy 06/17/2015  1:15 PM   Type of Therapy: Group Therapy  Participation Level: Did Not Attend. Patient invited to participate but declined.   Tashunda Vandezande, MSW, LCSWA Clinical Social Worker [facility] [phone]   

## 2015-06-17 NOTE — Progress Notes (Signed)
1:1 note  Todd Friedman remains safe on the unit. He reports having seen family members outside in the parking lot and hearing family members in the hallway. Radiographer, therapeutic(Safety sitter confirms family members were not in parking lot.) He remains restless, though Recruitment consultantsafety sitter and this Clinical research associatewriter have encouraged him to lie down. Will continue to monitor for needs/safety.

## 2015-06-17 NOTE — ED Notes (Signed)
Bed: [id number] Expected date:  Expected time:  Means of arrival:  Comments[facility]

## 2015-06-17 NOTE — Progress Notes (Signed)
[facility] to rule out D[facility] and to receive IV benzos per order from [place], [place].  Pt is still actively hallucinating and attempting to go into other patient's room looking for his wife.

## 2015-06-17 NOTE — Progress Notes (Signed)
[facility] MD Progress Note  06/17/2015 5:50 PM Todd Friedman  MRN:  [id number] Subjective:  During the night Todd Friedman developed delirium. He was hearing and seeing things. He was sent to the ED but came right back. An ammonia level was ordered. His cognition has fluctuated during the day. He was placed on 1: 1 Obs. He has evidenced periods of disorientation and visual hallucinations Principal Problem: Alcohol dependence Diagnosis:   Patient Active Problem List   Diagnosis Date Noted  . Cocaine abuse with cocaine-induced mood disorder [F14.14] 06/15/2015  . Alcohol dependence [F10.20] 09/18/2014  . Substance induced mood disorder [F19.94] 09/18/2014   Total Time spent with patient: 30 minutes   Past Medical History:  Past Medical History  Diagnosis Date  . Hypertension   . High cholesterol   . Abdominal cramping, generalized   . IBS (irritable bowel syndrome)   . Irritable bowel syndrome (IBS)   . Alcoholism    History reviewed. No pertinent past surgical history. Family History:  Family History  Problem Relation Age of Onset  . Cancer Mother   . Hypertension Mother   . Leukemia Father   . Leukemia Other    Social History:  History  Alcohol Use  . Yes    Comment: "Lots of Liquor"     History  Drug Use  . Yes  . Special: Marijuana    History   Social History  . Marital Status: Married    Spouse Name: N/A  . Number of Children: N/A  . Years of Education: N/A   Social History Main Topics  . Smoking status: Current Every Day Smoker -- 0.50 packs/day    Types: Cigarettes  . Smokeless tobacco: Not on file  . Alcohol Use: Yes     Comment: "Lots of Liquor"  . Drug Use: Yes    Special: Marijuana  . Sexual Activity: Not on file   Other Topics Concern  . None   Social History Narrative   Additional History:    Sleep: Poor  Appetite:  Fair   Assessment:   Musculoskeletal: Strength & Muscle Tone: within normal limits Gait & Station: normal Patient leans:  normal   Psychiatric Specialty Exam: Physical Exam  Review of Systems  Constitutional: Negative.   HENT: Negative.   Eyes: Negative.   Respiratory: Negative.   Cardiovascular: Negative.   Gastrointestinal: Negative.   Genitourinary: Negative.   Musculoskeletal: Negative.   Skin: Negative.   Neurological: Negative.   Endo/Heme/Allergies: Negative.   Psychiatric/Behavioral: Positive for hallucinations and substance abuse. The patient is nervous/anxious and has insomnia.     Blood pressure 124/88, pulse 97, temperature 98.5 F (36.9 C), temperature source Oral, resp. rate 18, height  (1.702 m), weight 62.143 kg (137 lb), SpO2 96 %.Body mass index is 21.45 kg/(m^2).  General Appearance: Fairly Groomed  Patent attorney::  Fair  Speech:  Clear and Coherent  Volume:  Decreased  Mood:  Anxious  Affect:  Labile  Thought Process:  Tangential  Orientation:  Other:  place person  Thought Content:  symptoms answers what he is asked for  Suicidal Thoughts:  No  Homicidal Thoughts:  No  Memory:  Immediate;   Fair Recent;   Fair Remote;   Fair  Judgement:  Impaired  Insight:  Shallow  Psychomotor Activity:  Restlessness  Concentration:  Fair  Recall:  [employer] of Knowledge:Fair  Language: Fair  Akathisia:  No  Handed:  Right  AIMS (if indicated):     Assets:  Desire for Improvement Housing Social Support Vocational/Educational  ADL's:  Intact  Cognition: WNL  Sleep:  Number of Hours: 4.5     Current Medications: Current Facility-Administered Medications  Medication Dose Route Frequency Provider Last Rate Last Dose  . acetaminophen (TYLENOL) tablet 650 mg  650 mg Oral Q6H PRN Thermon LeylandLaura A Davis, NP      . alum & mag hydroxide-simeth (MAALOX/MYLANTA) 200-200-20 MG/5ML suspension 30 mL  30 mL Oral Q4H PRN Thermon LeylandLaura A Davis, NP      . benztropine (COGENTIN) tablet 1 mg  1 mg Oral BID PRN Rachael FeeIrving A Harrison Paulson, MD   1 mg at 06/17/15 1506  . feeding supplement (ENSURE ENLIVE) (ENSURE ENLIVE)  liquid 237 mL  237 mL Oral BID BM Rachael FeeIrving A Katrina Daddona, MD   237 mL at 06/17/15 1426  . haloperidol (HALDOL) tablet 5 mg  5 mg Oral Q6H PRN Rachael FeeIrving A Nelissa Bolduc, MD   5 mg at 06/17/15 1506  . hydrochlorothiazide (HYDRODIURIL) tablet 25 mg  25 mg Oral Daily Rachael FeeIrving A Teigan Sahli, MD   25 mg at 06/17/15 0753  . LORazepam (ATIVAN) tablet 1 mg  1 mg Oral Q6H PRN Rachael FeeIrving A Johna Kearl, MD      . LORazepam (ATIVAN) tablet 1 mg  1 mg Oral QID Rachael FeeIrving A Garan Frappier, MD   1 mg at 06/17/15 1718   Followed by  . [START ON 06/18/2015] LORazepam (ATIVAN) tablet 1 mg  1 mg Oral TID Rachael FeeIrving A Amirrah Quigley, MD       Followed by  . [START ON 06/19/2015] LORazepam (ATIVAN) tablet 1 mg  1 mg Oral BID Rachael FeeIrving A Brielyn Bosak, MD       Followed by  . [START ON 06/20/2015] LORazepam (ATIVAN) tablet 1 mg  1 mg Oral Daily Rachael FeeIrving A Arael Piccione, MD      . magnesium hydroxide (MILK OF MAGNESIA) suspension 30 mL  30 mL Oral Daily PRN Thermon LeylandLaura A Davis, NP      . mirtazapine (REMERON) tablet 15 mg  15 mg Oral QHS Sanjuana KavaAgnes I Nwoko, NP   15 mg at 06/16/15 2116  . multivitamin with minerals tablet 1 tablet  1 tablet Oral Daily Thermon LeylandLaura A Davis, NP   1 tablet at 06/17/15 0753  . nicotine (NICODERM CQ - dosed in mg/24 hours) patch 21 mg  21 mg Transdermal Daily Rachael FeeIrving A Lailynn Southgate, MD   21 mg at 06/17/15 0754  . propranolol (INDERAL) tablet 10 mg  10 mg Oral TID Sanjuana KavaAgnes I Nwoko, NP   10 mg at 06/17/15 1718  . thiamine (VITAMIN B-1) tablet 100 mg  100 mg Oral Daily Thermon LeylandLaura A Davis, NP   100 mg at 06/17/15 0753  . traZODone (DESYREL) tablet 50 mg  50 mg Oral QHS PRN Thermon LeylandLaura A Davis, NP   50 mg at 06/16/15 2155    Lab Results: No results found for this or any previous visit (from the past 48 hour(s)).  Physical Findings: AIMS: Facial and Oral Movements Muscles of Facial Expression: None, normal Lips and Perioral Area: None, normal Jaw: None, normal Tongue: None, normal,Extremity Movements Upper (arms, wrists, hands, fingers): None, normal Lower (legs, knees, ankles, toes): None, normal, Trunk Movements Neck,  shoulders, hips: None, normal, Overall Severity Severity of abnormal movements (highest score from questions above): None, normal Incapacitation due to abnormal movements: None, normal Patient's awareness of abnormal movements (rate only patient's report): No Awareness, Dental Status Current problems with teeth and/or dentures?: Yes Does patient usually wear dentures?: Yes  CIWA:  CIWA-Ar Total: 10 COWS:     Treatment Plan Summary: Daily contact with patient to assess and evaluate symptoms and progress in treatment and Medication management Supportive approach/coping skills Alcohol dependence/alcohol withdrawal delirium: continue the Ativan detox protocol, use Haldol 5 mg as needed will use Cogentin 1 gm BID PRN for EPS Will get an ammonia level Will continue 1:1 Obs for safety reasons  Medical Decision Making:  Review of Psycho-Social Stressors (1), Review or order clinical lab tests (1) and Review of Medication Regimen & Side Effects (2)     Ciarra Braddy A 06/17/2015, 5:50 PM

## 2015-06-17 NOTE — Progress Notes (Signed)
Pt did not attend group this evening.  

## 2015-06-17 NOTE — ED Provider Notes (Signed)
TIME SEEN: 5:50 AM  CHIEF COMPLAINT: alcohol withdrawal  HPI: Pt is a 54 y.o. male with history of hypertension and hyperlipidemia, alcohol abuse who presents to the emergency department from a here from behavioral[facility] hospital with concerns for delirium, tachycardia. Was sent here to receive IV benzodiazepine's and for further evaluation.  Patient states that he is here because he was hallucinating earlier this morning. States this is now gone. Denies history of withdrawal seizure or DTs in the past. Last drink was on Sunday, 3 days ago.  Patient was given Haldol and Ativan IM prior to arrival and appears to be doing much better.  ROS: See HPI Constitutional: no fever  Eyes: no drainage  ENT: no runny nose   Cardiovascular:  no chest pain  Resp: no SOB  GI: no vomiting GU: no dysuria Integumentary: no rash  Allergy: no hives  Musculoskeletal: no leg swelling  Neurological: no slurred speech ROS otherwise negative  PAST MEDICAL HISTORY/PAST SURGICAL HISTORY:  Past Medical History  Diagnosis Date  . Hypertension   . High cholesterol   . Abdominal cramping, generalized   . IBS (irritable bowel syndrome)   . Irritable bowel syndrome (IBS)   . Alcoholism     MEDICATIONS:  Prior to Admission medications   Not on File    ALLERGIES:  No Known Allergies  SOCIAL HISTORY:  History  Substance Use Topics  . Smoking status: Current Every Day Smoker -- 0.50 packs/day    Types: Cigarettes  . Smokeless tobacco: Not on file  . Alcohol Use: Yes     Comment: "Lots of Liquor"    FAMILY HISTORY: Family History  Problem Relation Age of Onset  . Cancer Mother   . Hypertension Mother   . Leukemia Father   . Leukemia Other     EXAM: BP 151/94 mmHg  Pulse 101  Temp(Src) 99.7 F (37.6 C) (Oral)  Resp 18  Ht  (1.702 m)  Wt 137 lb (62.143 kg)  BMI 21.45 kg/m2  SpO2 96% CONSTITUTIONAL: Alert and oriented to person and place but does appear to be confused to the year but  is able to tell me that he was hallucinating earlier today and why he is here in the emergency departmentand responds appropriately to questions. Well-appearing; well-nourished HEAD: Normocephalic EYES: Conjunctivae clear, PERRL ENT: normal nose; no rhinorrhea; moist mucous membranes; pharynx without lesions noted NECK: Supple, no meningismus, no LAD  CARD: regular and minimally tachycardic; S1 and S2 appreciated; no murmurs, no clicks, no rubs, no gallops RESP: Normal chest excursion without splinting or tachypnea; breath sounds clear and equal bilaterally; no wheezes, no rhonchi, no rales, no hypoxia or respiratory distress, speaking full sentences ABD/GI: Normal bowel sounds; non-distended; soft, non-tender, no rebound, no guarding, no peritoneal signs BACK:  The back appears normal and is non-tender to palpation, there is no CVA tenderness EXT: Normal ROM in all joints; non-tender to palpation; no edema; normal capillary refill; no cyanosis, no calf tenderness or swelling    SKIN: Normal color for age and race; warm NEURO: Moves all extremities equally, sensation to light touch intact diffusely, cranial nerves II through XII intact; patient is not tremulous PSYCH: The patient's mood and manner are appropriate. Grooming and personal hygiene are appropriate.  MEDICAL DECISION MAKING: Patient here for IV benzodiazepine for concerns for DTs, hallucinations with alcohol withdrawal.  Discussed with Karleen Hampshire, PA with behavioral health. Given patient is now markedly improved, tachycardia has resolved as well as his hallucinations I feel  patient can be safely transferred back to behavioral health hospital. I feel I am Ativan is appropriate for the patient as needed and likely not enough time was given for this medication to have any effect. I do not feel IV Ativan at this time is indicated. I do not feel at this time he needs repeat labs or further emergent workup. After he is safe to be transferred back to  behavioral health. Patient is comfortable with this plan. Karleen HampshireSpencer is also comfortable with this plan. Accepting physician is Dr. Dub MikesLugo.       Layla MawKristen N Ward, DO 06/17/15 [phone]

## 2015-06-17 NOTE — Progress Notes (Signed)
1:1 progress note:  Pt's behavior has not changed.  He is agitated, talking at the window to someone he thinks he sees outside.  The sitter reports he has been up and down the hall.  He seems to think he is supposed to have dental surgery and is talking about tooth pain.  The sitter reports that pt's wife was visiting earlier and told pt about some dental work that she had today.  Sitter seems to think that the patient is thinking about what his wife told him and now thinks he is supposed to have the same surgery.  He appears quite anxious and quickly moves from one place to another, but he has been redirectable.  Continue 1:1 for safety d/t pt's bizarre behavior and delusions.  Sitter with pt.  Pt safe at this time.

## 2015-06-17 NOTE — Progress Notes (Signed)
Recreation Therapy Notes  Date: 07.13.16 Time: 9:30 am Location: 300 [address] Dayroom  Group Topic: Stress Management  Goal Area(s) Addresses:  Patient will verbalize importance of using healthy stress management.  Patient will identify positive emotions associated with healthy stress management.   Intervention: Stress Management  Activity :  Guided Imagery.  LRT introduced and explained the stress management technique of guided imagery.  LRT used a script to deliver the technique.  Patients were asked to follow the scripts read a loud by the LRT to participate in the stress management technique.  Education:  Stress Management, Discharge Planning.   Education Outcome: Acknowledges edcuation/In group clarification offered/Needs additional education  Clinical Observations/Feedback: Patient did not attend group.   Caroll RancherMarjette Geena Weinhold, LRT/CTRS         Caroll RancherLindsay, [place] [address] A 06/17/2015 1:37 PM

## 2015-06-17 NOTE — ED Notes (Addendum)
Patient [facility] been a [facility] and was [facility]. Per EMS, patient is being sent over due to tachycardia and hallucinations. Pt was given ATIVAN 1mg  at 23:30, ATIVAN 2mg  and HALDOL 5mg  at 2:12 with improvement. Pt complains of mild abd pain. Furthermore, EMS reports patient was wondering into other patients room. EMS vital signs:BP 128/92, HR 116.

## 2015-06-17 NOTE — [facility] Group Notes (Signed)
[facility] LCSW Aftercare Discharge Planning Group Note  06/17/2015  8:45 AM  Participation Quality: Did Not Attend. Patient invited to participate but declined.  Atia Haupt, MSW, LCSWA Clinical Social Worker [facility] [phone]   

## 2015-06-17 NOTE — Progress Notes (Signed)
D: Todd Friedman has been pleasant, cooperative, and delusional this a.m. He has reported seeing white spots, which he is aware aren't really there. He has continued to see family members in the parking lot and hear voices coming from the hallway of family members. He denies SI and HI.  A: Meds given as ordered, including Ativan as indicated by CIWA scores. Q15 safety checks maintained.  Support/encouragement offered. Safety sitter remains in place.  R: Pt remains free from harm and continues with treatment. Will continue to monitor for needs/safety.

## 2015-06-18 ENCOUNTER — Emergency Department (HOSPITAL_COMMUNITY)
Admission: EM | Admit: 2015-06-18 | Discharge: 2015-06-18 | Discharge status: Absent without leave | Payer: 59 | Attending: Emergency Medicine | Admitting: Emergency Medicine

## 2015-06-18 ENCOUNTER — Inpatient Hospital Stay (HOSPITAL_COMMUNITY)
Admission: EM | Admit: 2015-06-18 | Discharge: 2015-06-21 | DRG: 897 | Disposition: A | Discharge status: Home / Self Care | Payer: 59 | Attending: Internal Medicine | Admitting: Internal Medicine

## 2015-06-18 ENCOUNTER — Encounter (HOSPITAL_COMMUNITY): Payer: Self-pay

## 2015-06-18 ENCOUNTER — Other Ambulatory Visit: Payer: Self-pay

## 2015-06-18 ENCOUNTER — Emergency Department (HOSPITAL_COMMUNITY): Payer: 59

## 2015-06-18 DIAGNOSIS — E860 Dehydration: Secondary | ICD-10-CM | POA: Diagnosis present

## 2015-06-18 DIAGNOSIS — K589 Irritable bowel syndrome without diarrhea: Secondary | ICD-10-CM | POA: Diagnosis present

## 2015-06-18 DIAGNOSIS — I10 Essential (primary) hypertension: Secondary | ICD-10-CM | POA: Diagnosis present

## 2015-06-18 DIAGNOSIS — E876 Hypokalemia: Secondary | ICD-10-CM | POA: Diagnosis present

## 2015-06-18 DIAGNOSIS — N179 Acute kidney failure, unspecified: Secondary | ICD-10-CM | POA: Diagnosis present

## 2015-06-18 DIAGNOSIS — F1994 Other psychoactive substance use, unspecified with psychoactive substance-induced mood disorder: Secondary | ICD-10-CM | POA: Diagnosis present

## 2015-06-18 DIAGNOSIS — F10231 Alcohol dependence with withdrawal delirium: Principal | ICD-10-CM | POA: Diagnosis present

## 2015-06-18 DIAGNOSIS — F17210 Nicotine dependence, cigarettes, uncomplicated: Secondary | ICD-10-CM | POA: Diagnosis present

## 2015-06-18 DIAGNOSIS — F39 Unspecified mood [affective] disorder: Secondary | ICD-10-CM | POA: Diagnosis present

## 2015-06-18 DIAGNOSIS — E785 Hyperlipidemia, unspecified: Secondary | ICD-10-CM | POA: Diagnosis present

## 2015-06-18 DIAGNOSIS — E780 Pure hypercholesterolemia: Secondary | ICD-10-CM | POA: Diagnosis present

## 2015-06-18 DIAGNOSIS — F10230 Alcohol dependence with withdrawal, uncomplicated: Secondary | ICD-10-CM | POA: Diagnosis not present

## 2015-06-18 DIAGNOSIS — F10239 Alcohol dependence with withdrawal, unspecified: Secondary | ICD-10-CM | POA: Diagnosis present

## 2015-06-18 DIAGNOSIS — F1020 Alcohol dependence, uncomplicated: Secondary | ICD-10-CM | POA: Diagnosis present

## 2015-06-18 DIAGNOSIS — F10939 Alcohol use, unspecified with withdrawal, unspecified: Secondary | ICD-10-CM | POA: Diagnosis present

## 2015-06-18 DIAGNOSIS — F1290 Cannabis use, unspecified, uncomplicated: Secondary | ICD-10-CM | POA: Diagnosis present

## 2015-06-18 DIAGNOSIS — F10931 Alcohol use, unspecified with withdrawal delirium: Secondary | ICD-10-CM | POA: Diagnosis present

## 2015-06-18 LAB — CBC WITH DIFFERENTIAL/PLATELET
Basophils Absolute: 0 10*3/uL (ref 0.0–0.1)
Basophils Relative: 0 % (ref 0–1)
EOS PCT: 1 % (ref 0–5)
Eosinophils Absolute: 0.1 10*3/uL (ref 0.0–0.7)
HCT: 38.6 % — ABNORMAL LOW (ref 39.0–52.0)
HEMOGLOBIN: 13.3 g/dL (ref 13.0–17.0)
Lymphocytes Relative: 21 % (ref 12–46)
Lymphs Abs: 2.4 10*3/uL (ref 0.7–4.0)
MCH: 31.4 pg (ref 26.0–34.0)
MCHC: 34.5 g/dL (ref 30.0–36.0)
MCV: 91 fL (ref 78.0–100.0)
MONOS PCT: 11 % (ref 3–12)
Monocytes Absolute: 1.2 10*3/uL — ABNORMAL HIGH (ref 0.1–1.0)
NEUTROS PCT: 67 % (ref 43–77)
Neutro Abs: 7.3 10*3/uL (ref 1.7–7.7)
Platelets: 166 10*3/uL (ref 150–400)
RBC: 4.24 MIL/uL (ref 4.22–5.81)
RDW: 13.3 % (ref 11.5–15.5)
WBC: 11 10*3/uL — AB (ref 4.0–10.5)

## 2015-06-18 LAB — URINALYSIS, ROUTINE W REFLEX MICROSCOPIC
Bilirubin Urine: NEGATIVE
Glucose, UA: NEGATIVE mg/dL
Hgb urine dipstick: NEGATIVE
KETONES UR: NEGATIVE mg/dL
Nitrite: NEGATIVE
Protein, ur: NEGATIVE mg/dL
SPECIFIC GRAVITY, URINE: 1.008 (ref 1.005–1.030)
UROBILINOGEN UA: 0.2 mg/dL (ref 0.0–1.0)
pH: 7 (ref 5.0–8.0)

## 2015-06-18 LAB — COMPREHENSIVE METABOLIC PANEL
ALBUMIN: 4.3 g/dL (ref 3.5–5.0)
ALK PHOS: 76 U/L (ref 38–126)
ALT: 53 U/L (ref 17–63)
AST: 63 U/L — ABNORMAL HIGH (ref 15–41)
Anion gap: 7 (ref 5–15)
BUN: 24 mg/dL — ABNORMAL HIGH (ref 6–20)
CHLORIDE: 100 mmol/L — AB (ref 101–111)
CO2: 28 mmol/L (ref 22–32)
Calcium: 11.6 mg/dL — ABNORMAL HIGH (ref 8.9–10.3)
Creatinine, Ser: 1.48 mg/dL — ABNORMAL HIGH (ref 0.61–1.24)
GFR calc Af Amer: 59 mL/min — ABNORMAL LOW (ref >60)
GFR calc non Af Amer: 51 mL/min — ABNORMAL LOW (ref >60)
Glucose, Bld: 104 mg/dL — ABNORMAL HIGH (ref 65–99)
POTASSIUM: 3.3 mmol/L — AB (ref 3.5–5.1)
Sodium: 135 mmol/L (ref 135–145)
Total Bilirubin: 1 mg/dL (ref 0.3–1.2)
Total Protein: 7.5 g/dL (ref 6.5–8.1)

## 2015-06-18 LAB — URINE MICROSCOPIC-ADD ON

## 2015-06-18 LAB — MAGNESIUM: Magnesium: 1.6 mg/dL — ABNORMAL LOW (ref 1.7–2.4)

## 2015-06-18 LAB — MRSA PCR SCREENING: MRSA BY PCR: NEGATIVE

## 2015-06-18 LAB — I-STAT CG4 LACTIC ACID, ED: Lactic Acid, Venous: 0.82 mmol/L (ref 0.5–2.0)

## 2015-06-18 MED ORDER — SODIUM CHLORIDE 0.9 % IV BOLUS (SEPSIS)
1000.0000 mL | Freq: Once | INTRAVENOUS | Status: AC
  Administered 2015-06-18: 1000 mL via INTRAVENOUS

## 2015-06-18 MED ORDER — HYDRALAZINE HCL 20 MG/ML IJ SOLN
10.0000 mg | Freq: Four times a day (QID) | INTRAMUSCULAR | Status: DC | PRN
  Administered 2015-06-18: 10 mg via INTRAVENOUS
  Filled 2015-06-18: qty 1

## 2015-06-18 MED ORDER — LORAZEPAM 1 MG PO TABS
0.0000 mg | ORAL_TABLET | Freq: Two times a day (BID) | ORAL | Status: DC
  Administered 2015-06-18: 1 mg via ORAL
  Filled 2015-06-18: qty 1

## 2015-06-18 MED ORDER — SODIUM CHLORIDE 0.9 % IV BOLUS (SEPSIS): 1000.0000 mL | Freq: Once | INTRAVENOUS | Status: DC

## 2015-06-18 MED ORDER — THIAMINE HCL 100 MG/ML IJ SOLN: 100.0000 mg | Freq: Every day | INTRAMUSCULAR | Status: DC

## 2015-06-18 MED ORDER — ACETAMINOPHEN 325 MG PO TABS
650.0000 mg | ORAL_TABLET | Freq: Four times a day (QID) | ORAL | Status: DC | PRN
  Administered 2015-06-19 – 2015-06-20 (×3): 650 mg via ORAL
  Filled 2015-06-18 (×3): qty 2

## 2015-06-18 MED ORDER — HYDRALAZINE HCL 25 MG PO TABS: 25.0000 mg | ORAL_TABLET | Freq: Four times a day (QID) | ORAL | Status: DC | PRN

## 2015-06-18 MED ORDER — LORAZEPAM 2 MG/ML IJ SOLN
2.0000 mg | INTRAMUSCULAR | Status: DC | PRN
  Administered 2015-06-18: 2 mg via INTRAVENOUS
  Administered 2015-06-18: 3 mg via INTRAVENOUS
  Administered 2015-06-18: 2 mg via INTRAVENOUS
  Administered 2015-06-18: 3 mg via INTRAVENOUS
  Administered 2015-06-18: 2 mg via INTRAVENOUS
  Administered 2015-06-18: 3 mg via INTRAVENOUS
  Administered 2015-06-19 (×2): 2 mg via INTRAVENOUS
  Administered 2015-06-19 (×2): 3 mg via INTRAVENOUS
  Administered 2015-06-19 (×2): 2 mg via INTRAVENOUS
  Filled 2015-06-18: qty 1
  Filled 2015-06-18 (×2): qty 2
  Filled 2015-06-18: qty 1
  Filled 2015-06-18: qty 2
  Filled 2015-06-18: qty 1
  Filled 2015-06-18: qty 2
  Filled 2015-06-18 (×4): qty 1
  Filled 2015-06-18: qty 2

## 2015-06-18 MED ORDER — LORAZEPAM 2 MG/ML IJ SOLN
2.0000 mg | Freq: Once | INTRAMUSCULAR | Status: AC
  Administered 2015-06-18: 2 mg via INTRAVENOUS
  Filled 2015-06-18: qty 1

## 2015-06-18 MED ORDER — ONDANSETRON HCL 4 MG PO TABS
4.0000 mg | ORAL_TABLET | Freq: Four times a day (QID) | ORAL | Status: DC | PRN
Start: 2015-06-18 — End: 2015-06-21
  Administered 2015-06-20: 4 mg via ORAL
  Filled 2015-06-18: qty 1

## 2015-06-18 MED ORDER — DIPHENHYDRAMINE HCL 50 MG/ML IJ SOLN
50.0000 mg | Freq: Once | INTRAMUSCULAR | Status: AC
  Administered 2015-06-18: 50 mg via INTRAMUSCULAR
  Filled 2015-06-18 (×3): qty 1

## 2015-06-18 MED ORDER — ZIPRASIDONE MESYLATE 20 MG IM SOLR
20.0000 mg | Freq: Once | INTRAMUSCULAR | Status: AC
  Administered 2015-06-18: 20 mg via INTRAMUSCULAR
  Filled 2015-06-18 (×3): qty 20

## 2015-06-18 MED ORDER — VITAMIN B-1 100 MG PO TABS: 100.0000 mg | ORAL_TABLET | Freq: Every day | ORAL | Status: DC

## 2015-06-18 MED ORDER — LORAZEPAM 1 MG PO TABS: 0.0000 mg | ORAL_TABLET | Freq: Four times a day (QID) | ORAL | Status: DC

## 2015-06-18 MED ORDER — POTASSIUM CHLORIDE CRYS ER 20 MEQ PO TBCR
40.0000 meq | EXTENDED_RELEASE_TABLET | Freq: Once | ORAL | Status: AC
  Administered 2015-06-18: 40 meq via ORAL
  Filled 2015-06-18: qty 2

## 2015-06-18 MED ORDER — ACETAMINOPHEN 650 MG RE SUPP: 650.0000 mg | Freq: Four times a day (QID) | RECTAL | Status: DC | PRN

## 2015-06-18 MED ORDER — LORAZEPAM 2 MG/ML IJ SOLN: 0.0000 mg | Freq: Two times a day (BID) | INTRAMUSCULAR | Status: DC

## 2015-06-18 MED ORDER — SODIUM CHLORIDE 0.9 % IV SOLN
INTRAVENOUS | Status: DC
Start: 2015-06-18 — End: 2015-06-18

## 2015-06-18 MED ORDER — DIAZEPAM 5 MG PO TABS
5.0000 mg | ORAL_TABLET | Freq: Once | ORAL | Status: AC
Start: 2015-06-18 — End: 2015-06-18
  Administered 2015-06-18: 5 mg via ORAL
  Filled 2015-06-18: qty 1

## 2015-06-18 MED ORDER — THIAMINE HCL 100 MG/ML IJ SOLN
Freq: Once | INTRAVENOUS | Status: AC
  Administered 2015-06-18: 14:00:00 via INTRAVENOUS
  Filled 2015-06-18: qty 1000

## 2015-06-18 MED ORDER — LORAZEPAM 2 MG/ML IJ SOLN
0.0000 mg | Freq: Four times a day (QID) | INTRAMUSCULAR | Status: DC
Start: 2015-06-18 — End: 2015-06-18

## 2015-06-18 MED ORDER — ONDANSETRON HCL 4 MG/2ML IJ SOLN: 4.0000 mg | Freq: Four times a day (QID) | INTRAMUSCULAR | Status: DC | PRN

## 2015-06-18 MED ORDER — LORAZEPAM 2 MG/ML IJ SOLN: 0.0000 mg | Freq: Four times a day (QID) | INTRAMUSCULAR | Status: DC

## 2015-06-18 MED ORDER — LORAZEPAM 2 MG/ML IJ SOLN
0.0000 mg | Freq: Two times a day (BID) | INTRAMUSCULAR | Status: DC
Start: 2015-06-18 — End: 2015-06-18

## 2015-06-18 MED ORDER — ENOXAPARIN SODIUM 40 MG/0.4ML ~~LOC~~ SOLN
40.0000 mg | SUBCUTANEOUS | Status: DC
  Administered 2015-06-18 – 2015-06-20 (×3): 40 mg via SUBCUTANEOUS
  Filled 2015-06-18 (×4): qty 0.4

## 2015-06-18 NOTE — Progress Notes (Signed)
Pt has been up all night.  He is getting more aggressive with the sitter.  Speech is still garbled and tangential.  He cannot be still or relax for more than a couple of minutes at a time.  Sitter had to catch pt a couple of times to keep him from falling.  He is very lethargic from the medications given last night, but cannot seem to relax enough to fall asleep.  Continue 1:1 for safety.  Sitter at bedside.  Pt remains safe at time.

## 2015-06-18 NOTE — Significant Event (Cosign Needed)
Made aware per NS that patient is mildly agitated but direct able for short periods of time. Patient admitted with alcohol abuse/substance induced mood disorder . AVH persist, Will retrieve baseline EKG dated; 17 Jun 2015 23:50 c/w borderline sinus tachycardia, left axis deviation, QRS voltage criteria suggestive of LVH, no ST and or TW abn noted, QTC 461 ms. Last lab values 7/10 note BMP wnl. Will proceed with Geodon 20 mg IM x one and Bendadryl 50 mg IM x one.  Attending Dub MikesLugo MD

## 2015-06-18 NOTE — Progress Notes (Signed)
Security notified to [facility]. No belongings in locker.

## 2015-06-18 NOTE — ED Notes (Signed)
[facility].  [facility].  [facility].

## 2015-06-18 NOTE — ED Notes (Signed)
Bed: WA17 Expected date:  Expected time:  Means of arrival:  [facility]

## 2015-06-18 NOTE — ED Notes (Addendum)
Per EMS[facility] due to increasing delirium tremens.  Denies pain.  Pt reports last drink x 4 days ago and typically drinks a quart per day.  [facility] report the Pt has been hallucinations and "attempting to pick nonexistent items off the floor and EMS stretcher."  [facility].  Pt is currently A & Ox4.  No tremors noted.

## 2015-06-18 NOTE — ED Notes (Addendum)
Per chart review, Pt given Ativan  at 0822.

## 2015-06-18 NOTE — ED Notes (Signed)
MD at bedside. 

## 2015-06-18 NOTE — Progress Notes (Signed)
On approach pt presented with garbled speech, unsteady gait and tremulous. Pt A&O only to self. Pt mental status was altered and pt noted to be confused. Pt was trying to pick up things off of the floor that was not there. While administering meds, pt was unable to hold onto the cup d/t severe tremors. Pt continues to decompensate and meds are not been effective. It was reported this morning in report, that pt has not slept in two days. Pt remains restless this morning. May, NP and Minerva AreolaEric, [facility] made aware of pt status. [facility] per NP order.

## 2015-06-18 NOTE — ED Provider Notes (Signed)
CSN: [id number]     Arrival date & time 06/18/15  [id number] History   First MD Initiated Contact with Patient 06/18/15 1008     Chief Complaint  Patient presents with  . Delirium Tremens (DTS)     (Consider location/radiation/quality/duration/timing/severity/associated sxs/prior Treatment) HPI  54 year old male with a history of a cause him presents from behavioral health with concern for alcohol withdrawal. He has been there for the last 4 days. According to EMS, behavioral health as they have maxed out their alcohol withdrawal medication regimen and that he possibly needs admission. The patient is apparently been hallucinating which he was not upon arrival. For EMS he was picking at things and occasionally turning over his shoulder but was not responding to other external stimuli. His exact baseline is unknown. The patient has not been febrile. It is unknown exactly how much benzodiazepines he has been receiving, last received and Zosyn around 8 AM. Patient endorses a cough. When asked orientation questions he knows he is in the hospital and was off on the date by one day. While talking the patient he does occasionally internal over shoulder, asked if someone is there any says no.  Past Medical History  Diagnosis Date  . Hypertension   . High cholesterol   . Abdominal cramping, generalized   . IBS (irritable bowel syndrome)   . Irritable bowel syndrome (IBS)   . Alcoholism    History reviewed. No pertinent past surgical history. Family History  Problem Relation Age of Onset  . Cancer Mother   . Hypertension Mother   . Leukemia Father   . Leukemia Other    History  Substance Use Topics  . Smoking status: Current Every Day Smoker -- 0.50 packs/day    Types: Cigarettes  . Smokeless tobacco: Not on file  . Alcohol Use: Yes     Comment: "Lots of Liquor"    Review of Systems  Constitutional: Negative for fever.  Respiratory: Positive for cough and chest tightness. Negative for  shortness of breath.   Cardiovascular: Negative for chest pain.  Gastrointestinal: Negative for vomiting.  Neurological: Negative for headaches.  Psychiatric/Behavioral: Negative for confusion.  All other systems reviewed and are negative.     Allergies  Review of patient's allergies indicates no known allergies.  Home Medications   Prior to Admission medications   Not on File   There were no vitals taken for this visit. Physical Exam  Constitutional: He is oriented to person, place, and time. He appears well-developed and well-nourished.  HENT:  Head: Normocephalic and atraumatic.  Right Ear: External ear normal.  Left Ear: External ear normal.  Nose: Nose normal.  Eyes: Right eye exhibits no discharge. Left eye exhibits no discharge.  Neck: Neck supple.  Cardiovascular: Regular rhythm, normal heart sounds and intact distal pulses.  Tachycardia present.   Pulmonary/Chest: Effort normal and breath sounds normal. He has no wheezes. He has no rales.  Abdominal: Soft. He exhibits no distension. There is no tenderness.  Musculoskeletal: He exhibits no edema.  Neurological: He is alert and oriented to person, place, and time.  Patient is alert and oriented. He is off by one day on the date but is otherwise correctly oriented. Normal strength and sensation in all 4 extremities. Mild tremor in his upper extremities.  Skin: Skin is warm and dry.  Nursing note and vitals reviewed.   ED Course  Procedures (including critical care time) Labs Review Labs Reviewed  COMPREHENSIVE METABOLIC PANEL - Abnormal; Notable for the  following:    Potassium 3.3 (*)    Chloride 100 (*)    Glucose, Bld 104 (*)    BUN 24 (*)    Creatinine, Ser 1.48 (*)    Calcium 11.6 (*)    AST 63 (*)    GFR calc non Af Amer 51 (*)    GFR calc Af Amer 59 (*)    All other components within normal limits  CBC WITH DIFFERENTIAL/PLATELET - Abnormal; Notable for the following:    WBC 11.0 (*)    HCT 38.6 (*)     Monocytes Absolute 1.2 (*)    All other components within normal limits  URINALYSIS, ROUTINE W REFLEX MICROSCOPIC (NOT AT [facility]) - Abnormal; Notable for the following:    APPearance CLOUDY (*)    Leukocytes, UA SMALL (*)    All other components within normal limits  MAGNESIUM - Abnormal; Notable for the following:    Magnesium 1.6 (*)    All other components within normal limits  URINE CULTURE  MRSA PCR SCREENING  URINE MICROSCOPIC-ADD ON  CBC  COMPREHENSIVE METABOLIC PANEL  I-STAT CG4 LACTIC ACID, ED    Imaging Review Dg Chest Port 1 View  06/18/2015   CLINICAL DATA:  Increased confusion, cough  EXAM: PORTABLE CHEST - 1 VIEW  COMPARISON:  None.  FINDINGS: The heart size and mediastinal contours are within normal limits. Both lungs are clear. The visualized skeletal structures are unremarkable.  IMPRESSION: No active disease.   Electronically Signed   By: Elige Ko   On: 06/18/2015 10:28     EKG Interpretation   Date/Time:  Thursday June 18 2015 10:43:25 EDT Ventricular Rate:  87 PR Interval:  146 QRS Duration: 87 QT Interval:  431 QTC Calculation: 518 R Axis:   3 Text Interpretation:  Sinus rhythm Probable left atrial enlargement  Borderline T abnormalities, inferior leads Prolonged QT interval no  significant change from yesterday Confirmed by Johnasia Liese  MD, Erice Ahles ([id number])  on 06/18/2015 5:04:51 PM      MDM   Final diagnoses:  Alcohol withdrawal, with delirium    Patient initially well appearing but has been having concerning symptoms of hallucinations. He did become progressively more delirious, given IV ativan, fluids and will need admission with close monitoring. Does have AKI on labs as well. Consult hospitalist, admit. I feel that alcohol withdrawal explains all of his symptoms, highly doubt stroke or acute infection.    Pricilla Loveless, MD 06/18/15 [phone]

## 2015-06-18 NOTE — H&P (Signed)
PCP:   Default, Provider, MD   Chief Complaint:  Confusion, alcohol withdrawal  HPI:  54 year old male who  has a past medical history of Hypertension; High cholesterol; Abdominal cramping, generalized; IBS (irritable bowel syndrome); Irritable bowel syndrome (IBS); and Alcoholism. Patient was initially admitted to behavioral health for alcohol withdrawal, and has been there for past 4 days. Patient received Ativan at behavioral health but continued to display confusion and worsening of symptoms so he was sent to the ER for further evaluation. Patient is able to provide any significant history has been hallucinating. There has been no history of fever. In the ED CIWA  protocol was started. Lab work revealed patient now has AK I, creatinine 1.48 with BUN 24 today his previous BUN/creatinine on 06/14/2015 was 5 and 0.87. Also patient with hypercalcemia 11.6.  Allergies:  No Known Allergies    Past Medical History  Diagnosis Date  . Hypertension   . High cholesterol   . Abdominal cramping, generalized   . IBS (irritable bowel syndrome)   . Irritable bowel syndrome (IBS)   . Alcoholism     History reviewed. No pertinent past surgical history.  Prior to Admission medications   Not on File    Social History:  reports that he has been smoking Cigarettes.  He has been smoking about 0.50 packs per day. He does not have any smokeless tobacco history on file. He reports that he drinks alcohol. He reports that he uses illicit drugs (Marijuana).  Family History  Problem Relation Age of Onset  . Cancer Mother   . Hypertension Mother   . Leukemia Father   . Leukemia Other     There were no vitals filed for this visit.  Review of Systems:  Unable to obtain due to patient's confusion   Physical Exam: Blood pressure 159/98, pulse 83, temperature 97.5 F (36.4 C), temperature source Oral, resp. rate 26, SpO2 100 %. Constitutional:   Patient is a well-developed and well-nourished  male*, confused. Head: Normocephalic and atraumatic Mouth: Mucus membranes moist Neck: Supple, No Thyromegaly Cardiovascular: RRR, S1 normal, S2 normal Pulmonary/Chest: CTAB, no wheezes, rales, or rhonchi Abdominal: Soft. Non-tender, non-distended, bowel sounds are normal, no masses, organomegaly, or guarding present.  Neurological: A&O x3, Strength is normal and symmetric bilaterally, cranial nerve II-XII are grossly intact, no focal motor deficit, sensory intact to light touch bilaterally.  Extremities : No Cyanosis, Clubbing or Edema  Labs on Admission:  Basic Metabolic Panel:  Recent Labs Lab 06/14/15 0756 06/18/15 1005  NA 140 135  K 3.6 3.3*  CL 103 100*  CO2 25 28  GLUCOSE 156* 104*  BUN <5* 24*  CREATININE 0.87 1.48*  CALCIUM 9.5 11.6*   Liver Function Tests:  Recent Labs Lab 06/14/15 0756 06/18/15 1005  AST 85* 63*  ALT 66* 53  ALKPHOS 92 76  BILITOT 0.7 1.0  PROT 6.9 7.5  ALBUMIN 4.0 4.3    Recent Labs Lab 06/14/15 0756  LIPASE 47    Recent Labs Lab 06/17/15 1930  AMMONIA 27   CBC:  Recent Labs Lab 06/14/15 0756 06/18/15 1005  WBC 7.3 11.0*  NEUTROABS 5.3 7.3  HGB 14.8 13.3  HCT 42.7 38.6*  MCV 91.0 91.0  PLT 219 166    Radiological Exams on Admission: Dg Chest Port 1 View  06/18/2015   CLINICAL DATA:  Increased confusion, cough  EXAM: PORTABLE CHEST - 1 VIEW  COMPARISON:  None.  FINDINGS: The heart size and mediastinal contours are  within normal limits. Both lungs are clear. The visualized skeletal structures are unremarkable.  IMPRESSION: No active disease.   Electronically Signed   By: Elige KoHetal  Patel   On: 06/18/2015 10:28    EKG: Independently reviewed. Sinus rhythm, prolonged QT interval 518  Assessment/Plan Active Problems:   Alcohol dependence   Substance induced mood disorder   Alcohol withdrawal   Hypercalcemia   AKI (acute kidney injury)  Alcohol withdrawal Will admit the patient to stepdown unit, and start on CIWA  protocol. We'll start IV fluids normal saline with thiamine and folate at 125 MR per hour.  Acute kidney injury Patient's creatinine worsened over the past 4 days likely from poor by mouth intake. Continue IV fluids. Will check BMP in a.m.  Hypercalcemia Likely from dehydration today calcium 11.6 on 06/14/2015 calcium was 9.5. Will continue with IV hydration and recheck calcium in the morning.  DVT prophylaxis Lovenox   Code status:Full code  Family discussion:No family at bedside   Time Spent on Admission: 60 minutes   [facility],Nima Kemppainen S [facility] Pager: [phone] 06/18/2015, 12:52 PM  If 7PM-7AM, please contact night-coverage  [email or web address]  Password [email or web address]

## 2015-06-18 NOTE — Progress Notes (Signed)
Pt has received Haldol 5 mg, cogentin 0.5 mg, Trazodone 100 mg, Geodon 20 mg IM, and Benadryl 50 mg IM since the last nursing note for the patient.  He is still very delusional and agitated.  He continues to try to leave the room and "go home".  He is still actively hallucinating.  The sitter reports it is difficult to redirect the pt now and keep him down even though he is lethargic from the medications given to him.  He has been standing at the window "talking to people" even though there is no one there.  His speech has become more garbled and he is difficult to understand.  He continues on 1:1 for safety.  Sitter is with patient.  Pt continues to be safe.

## 2015-06-19 DIAGNOSIS — F10230 Alcohol dependence with withdrawal, uncomplicated: Secondary | ICD-10-CM

## 2015-06-19 DIAGNOSIS — F1994 Other psychoactive substance use, unspecified with psychoactive substance-induced mood disorder: Secondary | ICD-10-CM

## 2015-06-19 DIAGNOSIS — E876 Hypokalemia: Secondary | ICD-10-CM | POA: Diagnosis present

## 2015-06-19 DIAGNOSIS — N179 Acute kidney failure, unspecified: Secondary | ICD-10-CM

## 2015-06-19 DIAGNOSIS — F10931 Alcohol use, unspecified with withdrawal delirium: Secondary | ICD-10-CM | POA: Diagnosis present

## 2015-06-19 DIAGNOSIS — F10231 Alcohol dependence with withdrawal delirium: Principal | ICD-10-CM

## 2015-06-19 LAB — CBC
HEMATOCRIT: 37.5 % — AB (ref 39.0–52.0)
HEMOGLOBIN: 12.8 g/dL — AB (ref 13.0–17.0)
MCH: 31 pg (ref 26.0–34.0)
MCHC: 34.1 g/dL (ref 30.0–36.0)
MCV: 90.8 fL (ref 78.0–100.0)
Platelets: 154 10*3/uL (ref 150–400)
RBC: 4.13 MIL/uL — AB (ref 4.22–5.81)
RDW: 13.3 % (ref 11.5–15.5)
WBC: 8.9 10*3/uL (ref 4.0–10.5)

## 2015-06-19 LAB — COMPREHENSIVE METABOLIC PANEL
ALT: 51 U/L (ref 17–63)
AST: 59 U/L — ABNORMAL HIGH (ref 15–41)
Albumin: 4 g/dL (ref 3.5–5.0)
Alkaline Phosphatase: 68 U/L (ref 38–126)
Anion gap: 8 (ref 5–15)
BUN: 15 mg/dL (ref 6–20)
CHLORIDE: 106 mmol/L (ref 101–111)
CO2: 25 mmol/L (ref 22–32)
Calcium: 10.1 mg/dL (ref 8.9–10.3)
Creatinine, Ser: 1.18 mg/dL (ref 0.61–1.24)
GFR calc Af Amer: >60 mL/min (ref >60)
GFR calc non Af Amer: >60 mL/min (ref >60)
Glucose, Bld: 87 mg/dL (ref 65–99)
Potassium: 3.2 mmol/L — ABNORMAL LOW (ref 3.5–5.1)
Sodium: 139 mmol/L (ref 135–145)
Total Bilirubin: 1.3 mg/dL — ABNORMAL HIGH (ref 0.3–1.2)
Total Protein: 7 g/dL (ref 6.5–8.1)

## 2015-06-19 MED ORDER — POTASSIUM CHLORIDE 10 MEQ/100ML IV SOLN
10.0000 meq | INTRAVENOUS | Status: AC
  Administered 2015-06-19 (×4): 10 meq via INTRAVENOUS
  Filled 2015-06-19 (×3): qty 100

## 2015-06-19 MED ORDER — MAGNESIUM SULFATE 2 GM/50ML IV SOLN
2.0000 g | Freq: Once | INTRAVENOUS | Status: AC
  Administered 2015-06-19: 2 g via INTRAVENOUS
  Filled 2015-06-19: qty 50

## 2015-06-19 NOTE — Progress Notes (Signed)
Progress Note   Todd GuyMaurice Ly ZOX:[id number] DOB: 11/17/1961 DOA: 06/18/2015 PCP: Default, Provider, MD   Brief Narrative:   Todd Friedman is an 54 y.o. male with a PMH of hypertension, hyperlipidemia IBS and alcoholism, [facility], who was subsequently admitted to the hospital 06/18/15 due to severe withdrawal symptoms and confusion/delirium tremens with acute kidney injury and hypercalcemia.  Assessment/Plan:   Principal Problem:   Delirium tremens in the setting of alcohol dependence/alcohol withdrawal - Continue to monitor in the ICU until stable. - Continue Ativan per CIWA detox protocol. CIWA score 8-11 overnight. - Continue to supplement thiamine/folic acid.  Active Problems:   Hypomagnesemia/hypokalemia - We'll give 2 g of IV magnesium and for potassium runs today.    Substance induced mood disorder - Management per psychiatry.    Hypercalcemia - Likely from dehydration. Calcium improved with hydration.    AKI (acute kidney injury) - Appears to be prerenal. Continue to hydrate. Creatinine improving with hydration.    DVT Prophylaxis - Lovenox ordered.  Family Communication: No family at the bedside. Disposition Plan: Home when stable. Code Status:     Code Status Orders        Start     Ordered   06/18/15 1312  Full code   Continuous     06/18/15 1311        IV Access:    Peripheral IV   Procedures and diagnostic studies:   Dg Chest Port 1 View  06/18/2015   CLINICAL DATA:  Increased confusion, cough  EXAM: PORTABLE CHEST - 1 VIEW  COMPARISON:  None.  FINDINGS: The heart size and mediastinal contours are within normal limits. Both lungs are clear. The visualized skeletal structures are unremarkable.  IMPRESSION: No active disease.   Electronically Signed   By: Elige KoHetal  Patel   On: 06/18/2015 10:28     Medical Consultants:    None.  Anti-Infectives:    None.  Subjective:   Todd Friedman is  somnolent and disoriented. Unable to answer questions at this time.  Objective:    Filed Vitals:   06/19/15 0200 06/19/15 0327 06/19/15 0400 06/19/15 0600  BP: 150/75  149/91 139/85  Pulse: 79  90 81  Temp:  98 F (36.7 C)    TempSrc:  Axillary    Resp: 22  23 21   Height:      Weight:      SpO2: 96%  98% 97%    Intake/Output Summary (Last 24 hours) at 06/19/15 0726 Last data filed at 06/19/15 0500  Gross per 24 hour  Intake      0 ml  Output   1700 ml  Net  -1700 ml    Exam: Gen: Somnolent/disoriented with safety mittens in place Cardiovascular:  RRR, No M/R/G Respiratory:  Lungs CTAB Gastrointestinal:  Abdomen soft, NT/ND, + BS Extremities:  No C/E/C   Data Reviewed:    Labs: Basic Metabolic Panel:  Recent Labs Lab 06/14/15 0756 06/18/15 1005 06/19/15 0335  NA 140 135 139  K 3.6 3.3* 3.2*  CL 103 100* 106  CO2 25 28 25   GLUCOSE 156* 104* 87  BUN <5* 24* 15  CREATININE 0.87 1.48* 1.18  CALCIUM 9.5 11.6* 10.1  MG  --  1.6*  --    GFR Estimated Creatinine Clearance: 67.2 mL/min (by C-G formula based on Cr of 1.18). Liver Function Tests:  Recent Labs Lab 06/14/15 0756 06/18/15 1005 06/19/15 0335  AST 85* 63*  59*  ALT 66* 53 51  ALKPHOS 92 76 68  BILITOT 0.7 1.0 1.3*  PROT 6.9 7.5 7.0  ALBUMIN 4.0 4.3 4.0    Recent Labs Lab 06/14/15 0756  LIPASE 47    Recent Labs Lab 06/17/15 1930  AMMONIA 27   CBC:  Recent Labs Lab 06/14/15 0756 06/18/15 1005 06/19/15 0335  WBC 7.3 11.0* 8.9  NEUTROABS 5.3 7.3  --   HGB 14.8 13.3 12.8*  HCT 42.7 38.6* 37.5*  MCV 91.0 91.0 90.8  PLT 219 166 154   Sepsis Labs:  Recent Labs Lab 06/14/15 0756 06/18/15 1005 06/18/15 1029 06/19/15 0335  WBC 7.3 11.0*  --  8.9  LATICACIDVEN  --   --  0.82  --    Microbiology Recent Results (from the past 240 hour(s))  MRSA PCR Screening     Status: None   Collection Time: 06/18/15  2:13 PM  Result Value Ref Range Status   MRSA by PCR NEGATIVE  NEGATIVE Final    Comment:        The GeneXpert MRSA Assay (FDA approved for NASAL specimens only), is one component of a comprehensive MRSA colonization surveillance program. It is not intended to diagnose MRSA infection nor to guide or monitor treatment for MRSA infections.      Medications:   . enoxaparin (LOVENOX) injection  40 mg Subcutaneous Q24H   Continuous Infusions:   Time spent: 35 minutes.  The patient is medically complex and requires high complexity decision making.    LOS: 1 day   RAMA,CHRISTINA  [facility] Pager [phone]. If unable to reach me by pager, please call my cell phone at [phone].  *Please refer to [email or web address], password [email or web address] to get updated schedule on who will round on this patient, as hospitalists switch teams weekly. If 7PM-7AM, please contact night-coverage at [email or web address], password [email or web address] for any overnight needs.  06/19/2015, 7:26 AM

## 2015-06-19 NOTE — Progress Notes (Signed)
Utilization review completed.  

## 2015-06-19 NOTE — Progress Notes (Signed)
  [facility] :  Will you be returning to the same living situation after discharge:  No. [facility] At discharge, do you have transportation home?: Yes,   transfered by EMS Do you have the ability to pay for your medications: Yes,  [facility]  Release of information consent forms completed and in the chart;  Patient's signature needed at discharge.  Patient to Follow up at: Follow-up Information    Follow up with [facility] On 06/22/2015.   Why:  Call office to schedule appt for IOP services.   Contact information:   [address],  [place], [place] [address] Phone: [phone]      Patient denies SI/HI: Yes,  denies    Safety Planning and Suicide Prevention discussed: Yes,  with patient and wife  Have you used any form of tobacco in the last 30 days? (Cigarettes, Smokeless Tobacco, Cigars, and/or Pipes): Yes  Has patient been referred to the Quitline?: Patient refused referral  Aleister Lady, [place] CarboKristin L 06/19/2015, 8:17 AM

## 2015-06-20 LAB — COMPREHENSIVE METABOLIC PANEL
ALT: 44 U/L (ref 17–63)
ANION GAP: 9 (ref 5–15)
AST: 42 U/L — ABNORMAL HIGH (ref 15–41)
Albumin: 3.9 g/dL (ref 3.5–5.0)
Alkaline Phosphatase: 62 U/L (ref 38–126)
BUN: 15 mg/dL (ref 6–20)
CALCIUM: 9.8 mg/dL (ref 8.9–10.3)
CO2: 26 mmol/L (ref 22–32)
CREATININE: 1.08 mg/dL (ref 0.61–1.24)
Chloride: 103 mmol/L (ref 101–111)
GFR calc non Af Amer: >60 mL/min (ref >60)
GLUCOSE: 69 mg/dL (ref 65–99)
POTASSIUM: 3.1 mmol/L — AB (ref 3.5–5.1)
Sodium: 138 mmol/L (ref 135–145)
TOTAL PROTEIN: 7 g/dL (ref 6.5–8.1)
Total Bilirubin: 1 mg/dL (ref 0.3–1.2)

## 2015-06-20 LAB — URINE CULTURE: CULTURE: NO GROWTH

## 2015-06-20 LAB — MAGNESIUM: Magnesium: 2.1 mg/dL (ref 1.7–2.4)

## 2015-06-20 NOTE — Progress Notes (Signed)
Progress Note   [name] ZOX:[id number] DOB: January 05, 1961 DOA: Jun 25, 2015 PCP: Default, Provider, MD   Brief Narrative:   Todd Friedman is an 54 y.o. male with a PMH of hypertension, hyperlipidemia IBS and alcoholism, initially admitted to [facility] for alcohol withdrawal, who was subsequently admitted to the hospital 06-25-2015 due to severe withdrawal symptoms and confusion/delirium tremens with acute kidney injury and hypercalcemia.  Assessment/Plan:   Principal Problem:   Delirium tremens in the setting of alcohol dependence/alcohol withdrawal - Continue to monitor in the ICU until stable. - Continue Ativan per CIWA detox protocol. CIWA score improved, 1-2 now. - Continue to supplement thiamine/folic acid.  Active Problems:   Hypomagnesemia/hypokalemia - Repleted.     Substance induced mood disorder - Management per psychiatry.    Hypercalcemia - Likely from dehydration. Calcium improved with hydration.    AKI (acute kidney injury) - Appears to be prerenal. Continue to hydrate. Creatinine improving with hydration.    DVT Prophylaxis - Lovenox ordered.  Family Communication: No family at the bedside. Disposition Plan: Home vs. [facility] when no further signs of DTs, possibly 06/21/15. Code Status:     Code Status Orders        Start     Ordered   Todd 21, 2016 1312  Full code   Continuous     Jun 25, 2015 1311        IV Access:    Peripheral IV   Procedures and diagnostic studies:   Dg Chest Port 1 View  06-25-15   CLINICAL DATA:  Increased confusion, cough  EXAM: PORTABLE CHEST - 1 VIEW  COMPARISON:  None.  FINDINGS: The heart size and mediastinal contours are within normal limits. Both lungs are clear. The visualized skeletal structures are unremarkable.  IMPRESSION: No active disease.   Electronically Signed   By: Elige Ko   On: 2015-06-25 10:28     Medical Consultants:    None.  Anti-Infectives:    None.  Subjective:   Todd Friedman is awake  and alert.  Denies confusion or disorientation.  Has some nausea but minimal tremulousness and anxiety.    Objective:    Filed Vitals:   06/20/15 0351 06/20/15 0400 06/20/15 0600 06/20/15 0700  BP:  142/89 151/88   Pulse:  83 90   Temp: 98.8 F (37.1 C)   98.7 F (37.1 C)  TempSrc: Oral   Oral  Resp:  23 25   Height:      Weight:      SpO2:  97% 95%     Intake/Output Summary (Last 24 hours) at 06/20/15 0720 Last data filed at 06/20/15 0600  Gross per 24 hour  Intake    650 ml  Output   1200 ml  Net   -550 ml    Exam: Gen: Awake/alert Cardiovascular:  RRR, No M/R/G Respiratory:  Lungs CTAB Gastrointestinal:  Abdomen soft, NT/ND, + BS Extremities:  No C/E/C   Data Reviewed:    Labs: Basic Metabolic Panel:  Recent Labs Lab 06/14/15 0756 06/25/15 1005 06/19/15 0335 06/20/15 0402  NA 140 135 139  --   K 3.6 3.3* 3.2*  --   CL 103 100* 106  --   CO2 --   GLUCOSE 156* 104* 87  --   BUN <5* 24* 15  --   CREATININE 0.87 1.48* 1.18  --   CALCIUM 9.5 11.6* 10.1  --   MG  --  1.6*  --  2.1  GFR Estimated Creatinine Clearance: 63.7 mL/min (by C-G formula based on Cr of 1.18). Liver Function Tests:  Recent Labs Lab 06/14/15 0756 06/18/15 1005 06/19/15 0335  AST 85* 63* 59*  ALT 66* 53 51  ALKPHOS 92 76 68  BILITOT 0.7 1.0 1.3*  PROT 6.9 7.5 7.0  ALBUMIN 4.0 4.3 4.0    Recent Labs Lab 06/14/15 0756  LIPASE 47    Recent Labs Lab 06/17/15 1930  AMMONIA 27   CBC:  Recent Labs Lab 06/14/15 0756 06/18/15 1005 06/19/15 0335  WBC 7.3 11.0* 8.9  NEUTROABS 5.3 7.3  --   HGB 14.8 13.3 12.8*  HCT 42.7 38.6* 37.5*  MCV 91.0 91.0 90.8  PLT 219 166 154   Sepsis Labs:  Recent Labs Lab 06/14/15 0756 06/18/15 1005 06/18/15 1029 06/19/15 0335  WBC 7.3 11.0*  --  8.9  LATICACIDVEN  --   --  0.82  --    Microbiology Recent Results (from the past 240 hour(s))  MRSA PCR Screening     Status: None   Collection Time: 06/18/15  2:13  PM  Result Value Ref Range Status   MRSA by PCR NEGATIVE NEGATIVE Final    Comment:        The GeneXpert MRSA Assay (FDA approved for NASAL specimens only), is one component of a comprehensive MRSA colonization surveillance program. It is not intended to diagnose MRSA infection nor to guide or monitor treatment for MRSA infections.   Culture, Urine     Status: None (Preliminary result)   Collection Time: 06/18/15  3:29 PM  Result Value Ref Range Status   Specimen Description URINE, CLEAN CATCH  Final   Special Requests NONE  Final   Culture   Final    NO GROWTH < 24 HOURS [facility]    Report Status PENDING  Incomplete     Medications:   . enoxaparin (LOVENOX) injection  40 mg Subcutaneous Q24H   Continuous Infusions:   Time spent: 25 minutes.    LOS: 2 days   Roswell Ndiaye  [facility] Pager [phone]. If unable to reach me by pager, please call my cell phone at [phone].  *Please refer to [email or web address], password [email or web address] to get updated schedule on who will round on this patient, as hospitalists switch teams weekly. If 7PM-7AM, please contact night-coverage at [email or web address], password [email or web address] for any overnight needs.  06/20/2015, 7:20 AM

## 2015-06-21 LAB — BASIC METABOLIC PANEL
Anion gap: 5 (ref 5–15)
BUN: 15 mg/dL (ref 6–20)
CALCIUM: 9.7 mg/dL (ref 8.9–10.3)
CO2: 28 mmol/L (ref 22–32)
Chloride: 105 mmol/L (ref 101–111)
Creatinine, Ser: 0.98 mg/dL (ref 0.61–1.24)
GFR calc Af Amer: >60 mL/min (ref >60)
GFR calc non Af Amer: >60 mL/min (ref >60)
Glucose, Bld: 94 mg/dL (ref 65–99)
Potassium: 3.3 mmol/L — ABNORMAL LOW (ref 3.5–5.1)
SODIUM: 138 mmol/L (ref 135–145)

## 2015-06-21 MED ORDER — TRAZODONE HCL 50 MG PO TABS: 50.0000 mg | ORAL_TABLET | Freq: Every day | ORAL | Status: DC

## 2015-06-21 MED ORDER — POTASSIUM CHLORIDE CRYS ER 20 MEQ PO TBCR
40.0000 meq | EXTENDED_RELEASE_TABLET | Freq: Once | ORAL | Status: AC
  Administered 2015-06-21: 40 meq via ORAL
  Filled 2015-06-21: qty 2

## 2015-06-21 NOTE — Progress Notes (Signed)
Pt states he would like to go back to AA after d/c and seek a sponsor to assist with etoh abuse mentoring. Linward HeadlandBeverly, Berel Najjar D

## 2015-06-21 NOTE — Discharge Instructions (Signed)
Finding Treatment for Alcohol and Drug Addiction It can be hard to find the right place to get professional treatment. Here are some important things to consider:  There are different types of treatment to choose from.  Some programs are live-in (residential) while others are not (outpatient). Sometimes a combination is offered.  No single type of program is right for everyone.  Most treatment programs involve a combination of education, counseling, and a 12-step, spiritually-based approach.  There are non-spiritually based programs (not 12-step).  Some treatment programs are government sponsored. They are geared for patients without private insurance.  Treatment programs can vary in many respects such as:  Cost and types of insurance accepted.  Types of on-site medical services offered.  Length of stay, setting, and size.  Overall philosophy of treatment. A person may need specialized treatment or have needs not addressed by all programs. For example, adolescents need treatment appropriate for their age. Other people have secondary disorders that must be managed as well. Secondary conditions can include mental illness, such as depression or diabetes. Often, a period of detoxification from alcohol or drugs is needed. This requires medical supervision and not all programs offer this. THINGS TO CONSIDER WHEN SELECTING A TREATMENT PROGRAM   Is the program certified by the appropriate government agency? Even private programs must be certified and employ certified professionals.  Does the program accept your insurance? If not, can a payment plan be set up?  Is the facility clean, organized, and well run? Do they allow you to speak with graduates who can share their treatment experience with you? Can you tour the facility? Can you meet with staff?  Does the program meet the full range of individual needs?  Does the treatment program address sexual orientation and physical disabilities?  Do they provide age, gender, and culturally appropriate treatment services?  Is treatment available in languages other than English?  Is long-term aftercare support or guidance encouraged and provided?  Is assessment of an individual's treatment plan ongoing to ensure it meets changing needs?  Does the program use strategies to encourage reluctant patients to remain in treatment long enough to increase the likelihood of success?  Does the program offer counseling (individual or group) and other behavioral therapies?  Does the program offer medicine as part of the treatment regimen, if needed?  Is there ongoing monitoring of possible relapse? Is there a defined relapse prevention program? Are services or referrals offered to family members to ensure they understand addiction and the recovery process? This would help them support the recovering individual.  Are 12-step meetings held at the center or is transport available for patients to attend outside meetings? In countries outside of the [place], Magazine features editorsee local directories for contact information for services in your area. Document Released: 10/20/2005 Document Revised: 02/13/2012 Document Reviewed: 05/01/2008 [facility] Patient Information 2015 [place]. This information is not intended to replace advice given to you by your health care provider. Make sure you discuss any questions you have with your health care provider.  Alcohol Withdrawal Anytime drug use is interfering with normal living activities it has become abuse. This includes problems with family and friends. Psychological dependence has developed when your mind tells you that the drug is needed. This is usually followed by physical dependence when a continuing increase of drugs are required to get the same feeling or "high." This is known as addiction or chemical dependency. A person's risk is much higher if there is a history of chemical  dependency in the family. Mild  Withdrawal Following Stopping Alcohol, When Addiction or Chemical Dependency Has Developed When a person has developed tolerance to alcohol, any sudden stopping of alcohol can cause uncomfortable physical symptoms. Most of the time these are mild and consist of tremors in the hands and increases in heart rate, breathing, and temperature. Sometimes these symptoms are associated with anxiety, panic attacks, and bad dreams. There may also be stomach upset. Normal sleep patterns are often interrupted with periods of inability to sleep (insomnia). This may last for 6 months. Because of this discomfort, many people choose to continue drinking to get rid of this discomfort and to try to feel normal. Severe Withdrawal with Decreased or No Alcohol Intake, When Addiction or Chemical Dependency Has Developed About five percent of alcoholics will develop signs of severe withdrawal when they stop using alcohol. One sign of this is development of generalized seizures (convulsions). Other signs of this are severe agitation and confusion. This may be associated with believing in things which are not real or seeing things which are not really there (delusions and hallucinations). Vitamin deficiencies are usually present if alcohol intake has been long-term. Treatment for this most often requires hospitalization and close observation. Addiction can only be helped by stopping use of all chemicals. This is hard but may save your life. With continual alcohol use, possible outcomes are usually loss of self respect and esteem, violence, and death. Addiction cannot be cured but it can be stopped. This often requires outside help and the care of professionals. Treatment centers are listed in the yellow pages under Cocaine, Narcotics, and Alcoholics Anonymous. Most hospitals and clinics can refer you to a specialized care center. It is not necessary for you to go through the uncomfortable symptoms of withdrawal. Your caregiver can  provide you with medicines that will help you through this difficult period. Try to avoid situations, friends, or drugs that made it possible for you to keep using alcohol in the past. Learn how to say no. It takes a long period of time to overcome addictions to all drugs, including alcohol. There may be many times when you feel as though you want a drink. After getting rid of the physical addiction and withdrawal, you will have a lessening of the craving which tells you that you need alcohol to feel normal. Call your caregiver if more support is needed. Learn who to talk to in your family and among your friends so that during these periods you can receive outside help. Alcoholics Anonymous (AA) has helped many people over the years. To get further help, contact AA or call your caregiver, counselor, or clergyperson. Al-Anon and Alateen are support groups for friends and family members of an alcoholic. The people who love and care for an alcoholic often need help, too. For information about these organizations, check your phone directory or call a local alcoholism treatment center.  SEEK IMMEDIATE MEDICAL CARE IF:   You have a seizure.  You have a fever.  You experience uncontrolled vomiting or you vomit up blood. This may be bright red or look like black coffee grounds.  You have blood in the stool. This may be bright red or appear as a black, tarry, bad-smelling stool.  You become lightheaded or faint. Do not drive if you feel this way. Have someone else drive you or call [id number] for help.  You become more agitated or confused.  You develop uncontrolled anxiety.  You begin to see things that are  not really there (hallucinate). Your caregiver has determined that you completely understand your medical condition, and that your mental state is back to normal. You understand that you have been treated for alcohol withdrawal, have agreed not to drink any alcohol for a minimum of 1 day, will not operate a  car or other machinery for 24 hours, and have had an opportunity to ask any questions about your condition. Document Released: 08/31/2005 Document Revised: 02/13/2012 Document Reviewed: 07/09/2008 [facility] Patient Information 2015 [place], [place]. This information is not intended to replace advice given to you by your health care provider. Make sure you discuss any questions you have with your health care provider.  Alcohol Use Disorder Alcohol use disorder is a mental disorder. It is not a one-time incident of heavy drinking. Alcohol use disorder is the excessive and uncontrollable use of alcohol over time that leads to problems with functioning in one or more areas of daily living. People with this disorder risk harming themselves and others when they drink to excess. Alcohol use disorder also can cause other mental disorders, such as mood and anxiety disorders, and serious physical problems. People with alcohol use disorder often misuse other drugs.  Alcohol use disorder is common and widespread. Some people with this disorder drink alcohol to cope with or escape from negative life events. Others drink to relieve chronic pain or symptoms of mental illness. People with a family history of alcohol use disorder are at higher risk of losing control and using alcohol to excess.  SYMPTOMS  Signs and symptoms of alcohol use disorder may include the following:   Consumption ofalcohol inlarger amounts or over a longer period of time than intended.  Multiple unsuccessful attempts to cutdown or control alcohol use.   A great deal of time spent obtaining alcohol, using alcohol, or recovering from the effects of alcohol (hangover).  A strong desire or urge to use alcohol (cravings).   Continued use of alcohol despite problems at work, school, or home because of alcohol use.   Continued use of alcohol despite problems in relationships because of alcohol use.  Continued use of alcohol in situations  when it is physically hazardous, such as driving a car.  Continued use of alcohol despite awareness of a physical or psychological problem that is likely related to alcohol use. Physical problems related to alcohol use can involve the brain, heart, liver, stomach, and intestines. Psychological problems related to alcohol use include intoxication, depression, anxiety, psychosis, delirium, and dementia.   The need for increased amounts of alcohol to achieve the same desired effect, or a decreased effect from the consumption of the same amount of alcohol (tolerance).  Withdrawal symptoms upon reducing or stopping alcohol use, or alcohol use to reduce or avoid withdrawal symptoms. Withdrawal symptoms include:  Racing heart.  Hand tremor.  Difficulty sleeping.  Nausea.  Vomiting.  Hallucinations.  Restlessness.  Seizures. DIAGNOSIS Alcohol use disorder is diagnosed through an assessment by your health care provider. Your health care provider may start by asking three or four questions to screen for excessive or problematic alcohol use. To confirm a diagnosis of alcohol use disorder, at least two symptoms must be present within a 63-month period. The severity of alcohol use disorder depends on the number of symptoms:  Mild--two or three.  Moderate--four or five.  Severe--six or more. Your health care provider may perform a physical exam or use results from lab tests to see if you have physical problems resulting from alcohol use. Your  health care provider may refer you to a mental health professional for evaluation. TREATMENT  Some people with alcohol use disorder are able to reduce their alcohol use to low-risk levels. Some people with alcohol use disorder need to quit drinking alcohol. When necessary, mental health professionals with specialized training in substance use treatment can help. Your health care provider can help you decide how severe your alcohol use disorder is and what  type of treatment you need. The following forms of treatment are available:   Detoxification. Detoxification involves the use of prescription medicines to prevent alcohol withdrawal symptoms in the first week after quitting. This is important for people with a history of symptoms of withdrawal and for heavy drinkers who are likely to have withdrawal symptoms. Alcohol withdrawal can be dangerous and, in severe cases, cause death. Detoxification is usually provided in a hospital or in-patient substance use treatment facility.  Counseling or talk therapy. Talk therapy is provided by substance use treatment counselors. It addresses the reasons people use alcohol and ways to keep them from drinking again. The goals of talk therapy are to help people with alcohol use disorder find healthy activities and ways to cope with life stress, to identify and avoid triggers for alcohol use, and to handle cravings, which can cause relapse.  Medicines.Different medicines can help treat alcohol use disorder through the following actions:  Decrease alcohol cravings.  Decrease the positive reward response felt from alcohol use.  Produce an uncomfortable physical reaction when alcohol is used (aversion therapy).  Support groups. Support groups are run by people who have quit drinking. They provide emotional support, advice, and guidance. These forms of treatment are often combined. Some people with alcohol use disorder benefit from intensive combination treatment provided by specialized substance use treatment centers. Both inpatient and outpatient treatment programs are available. Document Released: 12/29/2004 Document Revised: 04/07/2014 Document Reviewed: 02/28/2013 [facility] Patient Information 2015 [place], [place]. This information is not intended to replace advice given to you by your health care provider. Make sure you discuss any questions you have with your health care provider.

## 2015-06-21 NOTE — Discharge Summary (Signed)
Physician Discharge Summary  Todd Friedman WUJ:[id number] DOB: 1960-12-14 DOA: 12-Jul-2015  PCP: Default, Provider, MD  Admit date: July 12, 2015 Discharge date: 06/21/2015   Recommendations for Outpatient Follow-Up:   1. Recommend close outpatient F/U for abstinence counseling.   Discharge Diagnosis:   Principal Problem:    Delirium tremens Active Problems:    Alcohol dependence    Substance induced mood disorder    Alcohol withdrawal    Hypercalcemia    AKI (acute kidney injury)    Hypokalemia    Hypomagnesemia   Discharge disposition:  Home.    Discharge Condition: Improved.  Diet recommendation: Regular.   History of Present Illness:   Todd Friedman is an 54 y.o. male with a PMH of hypertension, hyperlipidemia IBS and alcoholism, initially admitted to [facility] for alcohol withdrawal, who was subsequently admitted to the hospital 07/12/2015 due to severe withdrawal symptoms and confusion/delirium tremens with acute kidney injury and hypercalcemia.  Hospital Course by Problem:   Principal Problem:  Delirium tremens in the setting of alcohol dependence/alcohol withdrawal - Treated with Ativan per CIWA detox protocol. CIWA score improved, and was 0-1 at discharge. - Continue to supplement thiamine/folic acid.  Active Problems:  Hypomagnesemia/hypokalemia - Repleted.    Substance induced mood disorder - Management per psychiatry. Recommend close outpatient follow-up. - Patient verbalized where he will follow-up with the plan is for his sobriety. Plans to attend daily AA meetings.   Hypercalcemia - Likely from dehydration. Calcium improved with hydration.   AKI (acute kidney injury) - Appears to be prerenal. Resolved with hydration.    Medical Consultants:    None.   Discharge Exam:   Filed Vitals:   06/21/15 0445  BP: 132/87  Pulse: 78  Temp: 98.3 F (36.8 C)  Resp: 20   Filed Vitals:   06/20/15 1034 06/20/15 1500 06/20/15 2100 06/21/15  0445  BP: 126/77 126/66 122/77 132/87  Pulse: 96 85 99 78  Temp: 99.6 F (37.6 C) 98.2 F (36.8 C) 99.6 F (37.6 C) 98.3 F (36.8 C)  TempSrc:  Oral Oral Oral  Resp: Height:      Weight:      SpO2: 99% 99% 100% 100%    Gen:  NAD, no further tremulousness Cardiovascular:  RRR, No M/R/G Respiratory: Lungs CTAB Gastrointestinal: Abdomen soft, NT/ND with normal active bowel sounds. Extremities: No C/E/C   The results of significant diagnostics from this hospitalization (including imaging, microbiology, ancillary and laboratory) are listed below for reference.     Procedures and Diagnostic Studies:   Dg Chest Port 1 View  07-12-15   CLINICAL DATA:  Increased confusion, cough  EXAM: PORTABLE CHEST - 1 VIEW  COMPARISON:  None.  FINDINGS: The heart size and mediastinal contours are within normal limits. Both lungs are clear. The visualized skeletal structures are unremarkable.  IMPRESSION: No active disease.   Electronically Signed   By: Elige Ko   On: 07/12/2015 10:28     Labs:   Basic Metabolic Panel:  Recent Labs Lab 2015-07-12 1005 06/19/15 0335 06/20/15 0402 06/21/15 0449  NA 135 139 138 138  K 3.3* 3.2* 3.1* 3.3*  CL 100* 106 103 105  CO2 GLUCOSE 104* 87 69 94  BUN 24* CREATININE 1.48* 1.18 1.08 0.98  CALCIUM 11.6* 10.1 9.8 9.7  MG 1.6*  --  2.1  --    GFR Estimated Creatinine Clearance: 76.7 mL/min (by C-G formula based on  Cr of 0.98). Liver Function Tests:  Recent Labs Lab 06/18/15 1005 06/19/15 0335 06/20/15 0402  AST 63* 59* 42*  ALT 53 51 44  ALKPHOS 76 68 62  BILITOT 1.0 1.3* 1.0  PROT 7.5 7.0 7.0  ALBUMIN 4.3 4.0 3.9    Recent Labs Lab 06/17/15 1930  AMMONIA 27   CBC:  Recent Labs Lab 06/18/15 1005 06/19/15 0335  WBC 11.0* 8.9  NEUTROABS 7.3  --   HGB 13.3 12.8*  HCT 38.6* 37.5*  MCV 91.0 90.8  PLT 166 154   Microbiology Recent Results (from the past 240 hour(s))  MRSA PCR Screening      Status: None   Collection Time: 06/18/15  2:13 PM  Result Value Ref Range Status   MRSA by PCR NEGATIVE NEGATIVE Final    Comment:        The GeneXpert MRSA Assay (FDA approved for NASAL specimens only), is one component of a comprehensive MRSA colonization surveillance program. It is not intended to diagnose MRSA infection nor to guide or monitor treatment for MRSA infections.   Culture, Urine     Status: None   Collection Time: 06/18/15  3:29 PM  Result Value Ref Range Status   Specimen Description URINE, CLEAN CATCH  Final   Special Requests NONE  Final   Culture   Final    NO GROWTH 2 DAYS [facility]    Report Status 06/20/2015 FINAL  Final     Discharge Instructions:   Discharge Instructions    Call MD for:  extreme fatigue    Complete by:  As directed      Call MD for:  persistant dizziness or light-headedness    Complete by:  As directed      Call MD for:  persistant nausea and vomiting    Complete by:  As directed      Call MD for:    Complete by:  As directed   Extreme anxiety, shaking spells, seizures     Diet general    Complete by:  As directed      Increase activity slowly    Complete by:  As directed             Medication List    TAKE these medications        traZODone 50 MG tablet  Commonly known as:  DESYREL  Take 1 tablet (50 mg total) by mouth at bedtime.           Follow-up Information    Follow up with Burtis JunesBLOUNT,ALVIN VINCENT, MD. Schedule an appointment as soon as possible for a visit in 1 week.   Specialty:  Family Medicine   Why:  Hospital follow up.   Contact information:   [address] [address] [facility] [place] [id number] [phone]        Time coordinating discharge: 25 minutes.  Signed:  RAMA,CHRISTINA  Pager [phone] [facility] 06/21/2015, 4:20 PM

## 2015-07-01 DIAGNOSIS — F10231 Alcohol dependence with withdrawal delirium: Principal | ICD-10-CM

## 2015-07-01 NOTE — Discharge Summary (Signed)
Physician Discharge Summary Note  Patient:  Todd Friedman is an 54 y.o., male MRN:  [id number] DOB:  1961-10-05 Patient phone:  [phone] (home)  Patient address:   [address] [place] [address],  Total Time spent with patient: 20 minutes  Date of Admission:  06/14/2015 Date of Discharge: 06/18/2015  Reason for Admission:  Alcohol detox.   Principal Problem: Alcohol dependence Discharge Diagnoses: Patient Active Problem List   Diagnosis Date Noted  . Delirium tremens [F10.231] 06/19/2015  . Hypokalemia [E87.6] 06/19/2015  . Hypomagnesemia [E83.42] 06/19/2015  . Alcohol withdrawal [F10.239] 06/18/2015  . Hypercalcemia [E83.52] 06/18/2015  . AKI (acute kidney injury) [N17.9] 06/18/2015  . Cocaine abuse with cocaine-induced mood disorder [F14.14] 06/15/2015  . Alcohol dependence [F10.20] 09/18/2014  . Substance induced mood disorder [F19.94] 09/18/2014    Musculoskeletal: Strength & Muscle Tone: within normal limits Gait & Station: unsteady Patient leans: normal  Psychiatric Specialty Exam: Physical Exam  Review of Systems  Constitutional: Positive for malaise/fatigue.  HENT: Negative.   Eyes: Negative.   Respiratory: Negative.   Cardiovascular: Negative.   Gastrointestinal: Negative.   Genitourinary: Negative.   Musculoskeletal: Negative.   Skin: Negative.   Neurological: Positive for weakness.  Endo/Heme/Allergies: Negative.   Psychiatric/Behavioral: Positive for hallucinations and substance abuse. The patient is nervous/anxious and has insomnia.     Blood pressure 131/88, pulse 120, temperature 98.9 F (37.2 C), temperature source Oral, resp. rate 16, height  (1.702 m), weight 62.143 kg (137 lb), SpO2 96 %.Body mass index is 21.45 kg/(m^2).  General Appearance: Disheveled  Eye Contact::  Minimal  Speech:  Slurred  Volume:  Decreased  Mood:  Anxious and Dysphoric  Affect:  Labile  Thought Process:  Disorganized  Orientation:  Other:   disoriented as to place time  Thought Content:  hallucinations  Suicidal Thoughts:  No  Homicidal Thoughts:  No  Memory:  Immediate;   Poor Recent;   Poor Remote;   Poor  Judgement:  Impaired  Insight:  Lacking  Psychomotor Activity:  Restlessness  Concentration:  Poor  Recall:  Poor  Fund of Knowledge:Poor  Language: Poor  Akathisia:  No  Handed:  Right  AIMS (if indicated):     Assets:  Housing Social Support  ADL's:  Impaired  Cognition: Impaired,  Moderate  Sleep:  Number of Hours: 0   Have you used any form of tobacco in the last 30 days? (Cigarettes, Smokeless Tobacco, Cigars, and/or Pipes): Yes  Has this patient used any form of tobacco in the last 30 days? (Cigarettes, Smokeless Tobacco, Cigars, and/or Pipes) Yes, A prescription for an FDA-approved tobacco cessation medication was offered at discharge and the patient refused  Past Medical History:  Past Medical History  Diagnosis Date  . Hypertension   . High cholesterol   . Abdominal cramping, generalized   . IBS (irritable bowel syndrome)   . Irritable bowel syndrome (IBS)   . Alcoholism    History reviewed. No pertinent past surgical history. Family History:  Family History  Problem Relation Age of Onset  . Cancer Mother   . Hypertension Mother   . Leukemia Father   . Leukemia Other    Social History:  History  Alcohol Use  . Yes    Comment: "Lots of Liquor"     History  Drug Use  . Yes  . Special: Marijuana    History   Social History  . Marital Status: Married    Spouse Name: N/A  .  Number of Children: N/A  . Years of Education: N/A   Social History Main Topics  . Smoking status: Current Every Day Smoker -- 0.50 packs/day    Types: Cigarettes  . Smokeless tobacco: Not on file  . Alcohol Use: Yes     Comment: "Lots of Liquor"  . Drug Use: Yes    Special: Marijuana  . Sexual Activity: Not on file   Other Topics Concern  . None   Social History Narrative    Past Psychiatric  History: Hospitalizations:  Outpatient Care:  Substance Abuse Care:  Self-Mutilation:  Suicidal Attempts:  Violent Behaviors:   Risk to Self: Is patient at risk for suicide?: Yes Risk to Others:   Prior Inpatient Therapy:   Prior Outpatient Therapy:    Level of Care:  Medical Unit  Hospital Course:  By the fourth day of admission the patient was in full DT's requiring admission to a medical unit  Consults:  emergecy room physicians  Significant Diagnostic Studies:  As per the chart  Discharge Vitals:   Blood pressure 131/88, pulse 120, temperature 98.9 F (37.2 C), temperature source Oral, resp. rate 16, height 5\' 7"  (1.702 m), weight 62.143 kg (137 lb), SpO2 96 %. Body mass index is 21.45 kg/(m^2). Lab Results:   No results found for this or any previous visit (from the past 72 hour(s)).  Physical Findings: AIMS: Facial and Oral Movements Muscles of Facial Expression: None, normal Lips and Perioral Area: None, normal Jaw: None, normal Tongue: None, normal,Extremity Movements Upper (arms, wrists, hands, fingers): None, normal Lower (legs, knees, ankles, toes): None, normal, Trunk Movements Neck, shoulders, hips: None, normal, Overall Severity Severity of abnormal movements (highest score from questions above): None, normal Incapacitation due to abnormal movements: None, normal Patient's awareness of abnormal movements (rate only patient's report): No Awareness, Dental Status Current problems with teeth and/or dentures?: Yes Does patient usually wear dentures?: Yes  CIWA:  CIWA-Ar Total: 21 COWS:      See Psychiatric Specialty Exam and Suicide Risk Assessment completed by Attending Physician prior to discharge.  Discharge destination:  Other:  [facility]  Is patient on multiple antipsychotic therapies at discharge:  No   Has Patient had three or more failed trials of antipsychotic monotherapy by history:  No    Recommended Plan for Multiple  Antipsychotic Therapies: NA     Medication List    Notice    You have not been prescribed any medications.         Follow-up Information    Follow up with [facility] On 06/22/2015.   Why:  Call office to schedule appt for IOP services.   Contact information:   [address],  [place], [place] [address] Phone: [phone]      Follow-up recommendations:  Activity:  as per medical unit once ready for D/C Diet:  as per medical unit when ready for D/C  Comments:  Transferred to [facility] Unit for treatment of acute DT's requiring IV fluids as well as benzodaizepined  Total Discharge Time: 20 minutes  Signed: Meagan Ancona A 07/01/2015, 9:16 PM

## 2015-11-19 ENCOUNTER — Emergency Department (INDEPENDENT_AMBULATORY_CARE_PROVIDER_SITE_OTHER): Payer: 59

## 2015-11-19 ENCOUNTER — Encounter (HOSPITAL_COMMUNITY): Payer: Self-pay | Admitting: Emergency Medicine

## 2015-11-19 ENCOUNTER — Emergency Department (INDEPENDENT_AMBULATORY_CARE_PROVIDER_SITE_OTHER)
Admission: EM | Admit: 2015-11-19 | Discharge: 2015-11-19 | Disposition: A | Discharge status: Home / Self Care | Payer: 59 | Source: Home / Self Care | Attending: Emergency Medicine | Admitting: Emergency Medicine

## 2015-11-19 DIAGNOSIS — M545 Low back pain, unspecified: Secondary | ICD-10-CM

## 2015-11-19 DIAGNOSIS — S3992XA Unspecified injury of lower back, initial encounter: Secondary | ICD-10-CM

## 2015-11-19 MED ORDER — TRAMADOL HCL 50 MG PO TABS: ORAL_TABLET | ORAL | Status: DC

## 2015-11-19 MED ORDER — IBUPROFEN 800 MG PO TABS: 800.0000 mg | ORAL_TABLET | Freq: Four times a day (QID) | ORAL | Status: DC | PRN

## 2015-11-19 MED ORDER — METAXALONE 800 MG PO TABS: 800.0000 mg | ORAL_TABLET | Freq: Three times a day (TID) | ORAL | Status: DC

## 2015-11-19 MED ORDER — KETOROLAC TROMETHAMINE 60 MG/2ML IM SOLN: 60.0000 mg | Freq: Once | INTRAMUSCULAR | Status: DC

## 2015-11-19 MED ORDER — KETOROLAC TROMETHAMINE 60 MG/2ML IM SOLN
INTRAMUSCULAR | Status: AC
  Filled 2015-11-19: qty 2
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
## 2015-11-19 NOTE — Discharge Instructions (Signed)
Follow-up with orthopedic spine specialist. I think that you have some disc impingement, but there are no new findings on your x-rays today. You may need physical therapy. Take medications as written. Go to the ER if you accidentally urinate or defecate on yourself, for numbness or weakness in your legs, or other concerns
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
## 2015-11-19 NOTE — ED Notes (Signed)
Bed: UC07 Expected date:  Expected time:  Means of arrival:  Comments: Dirty "it's"

## 2016-03-15 ENCOUNTER — Ambulatory Visit (INDEPENDENT_AMBULATORY_CARE_PROVIDER_SITE_OTHER): Payer: BLUE CROSS/BLUE SHIELD | Admitting: Family

## 2016-03-15 ENCOUNTER — Telehealth: Payer: Self-pay | Admitting: Family

## 2016-03-15 ENCOUNTER — Other Ambulatory Visit (INDEPENDENT_AMBULATORY_CARE_PROVIDER_SITE_OTHER): Payer: BLUE CROSS/BLUE SHIELD

## 2016-03-15 ENCOUNTER — Encounter: Payer: Self-pay | Admitting: Family

## 2016-03-15 VITALS — BP 190/100 | HR 79 | Temp 98.7°F | Resp 16 | Ht 68.0 in | Wt 148.0 lb

## 2016-03-15 DIAGNOSIS — S61219A Laceration without foreign body of unspecified finger without damage to nail, initial encounter: Secondary | ICD-10-CM | POA: Insufficient documentation

## 2016-03-15 DIAGNOSIS — S61219D Laceration without foreign body of unspecified finger without damage to nail, subsequent encounter: Secondary | ICD-10-CM

## 2016-03-15 DIAGNOSIS — F17200 Nicotine dependence, unspecified, uncomplicated: Secondary | ICD-10-CM | POA: Diagnosis not present

## 2016-03-15 DIAGNOSIS — I10 Essential (primary) hypertension: Secondary | ICD-10-CM

## 2016-03-15 DIAGNOSIS — K580 Irritable bowel syndrome with diarrhea: Secondary | ICD-10-CM | POA: Diagnosis not present

## 2016-03-15 DIAGNOSIS — K589 Irritable bowel syndrome without diarrhea: Secondary | ICD-10-CM | POA: Insufficient documentation

## 2016-03-15 DIAGNOSIS — Z1159 Encounter for screening for other viral diseases: Secondary | ICD-10-CM

## 2016-03-15 DIAGNOSIS — Z1211 Encounter for screening for malignant neoplasm of colon: Secondary | ICD-10-CM

## 2016-03-15 LAB — COMPREHENSIVE METABOLIC PANEL
ALBUMIN: 4.4 g/dL (ref 3.5–5.2)
ALK PHOS: 66 U/L (ref 39–117)
ALT: 16 U/L (ref 0–53)
AST: 21 U/L (ref 0–37)
BILIRUBIN TOTAL: 0.4 mg/dL (ref 0.2–1.2)
BUN: 7 mg/dL (ref 6–23)
CALCIUM: 9.5 mg/dL (ref 8.4–10.5)
CO2: 29 mEq/L (ref 19–32)
Chloride: 105 mEq/L (ref 96–112)
Creatinine, Ser: 0.95 mg/dL (ref 0.40–1.50)
GFR: 105.03 mL/min (ref >60.00)
Glucose, Bld: 79 mg/dL (ref 70–99)
POTASSIUM: 3.8 meq/L (ref 3.5–5.1)
Sodium: 139 mEq/L (ref 135–145)
TOTAL PROTEIN: 7.2 g/dL (ref 6.0–8.3)

## 2016-03-15 MED ORDER — DICYCLOMINE HCL 20 MG PO TABS: 20.0000 mg | ORAL_TABLET | Freq: Three times a day (TID) | ORAL | Status: AC

## 2016-03-15 MED ORDER — HYDROCHLOROTHIAZIDE 25 MG PO TABS: 25.0000 mg | ORAL_TABLET | Freq: Every day | ORAL | Status: DC

## 2016-03-15 NOTE — Assessment & Plan Note (Signed)
Laceration of finger appears well-healed with no evidence of infection. Sutures removed without difficulty or complication. Continue basic wound care with soap and water. Follow-up if symptoms of infection develop.

## 2016-03-15 NOTE — Progress Notes (Signed)
Pre visit review using our clinic review tool, if applicable. No additional management support is needed unless otherwise documented below in the visit note. 

## 2016-03-15 NOTE — Patient Instructions (Addendum)
Thank you for choosing [facility].  Summary/Instructions:  Your prescription(s) have been submitted to your pharmacy or been printed and provided for you. Please take as directed and contact our office if you believe you are having problem(s) with the medication(s) or have any questions.  Please stop by the lab on the basement level of the building for your blood work. Your results will be released to MyChart (or called to you) after review, usually within 72 hours after test completion. If any changes need to be made, you will be notified at that same time.  If your symptoms worsen or fail to improve, please contact our office for further instruction, or in case of emergency go directly to the emergency room at the closest medical facility.   Please start taking hydrochlorothiazide for your blood pressure  Monitor your blood pressure at home sporadically throughout the day.  Start taking Bentyl as needed for abdominal cramping related to irritable bowel syndrome.  Hypertension Hypertension, commonly called high blood pressure, is when the force of blood pumping through your arteries is too strong. Your arteries are the blood vessels that carry blood from your heart throughout your body. A blood pressure reading consists of a higher number over a lower number, such as 110/72. The higher number (systolic) is the pressure inside your arteries when your heart pumps. The lower number (diastolic) is the pressure inside your arteries when your heart relaxes. Ideally you want your blood pressure below 120/80. Hypertension forces your heart to work harder to pump blood. Your arteries may become narrow or stiff. Having untreated or uncontrolled hypertension can cause heart attack, stroke, kidney disease, and other problems. RISK FACTORS Some risk factors for high blood pressure are controllable. Others are not.  Risk factors you cannot control include:   Race. You may be at higher risk if you  are African American.  Age. Risk increases with age.  Gender. Men are at higher risk than women before age 74 years. After age 66, women are at higher risk than men. Risk factors you can control include:  Not getting enough exercise or physical activity.  Being overweight.  Getting too much fat, sugar, calories, or salt in your diet.  Drinking too much alcohol. SIGNS AND SYMPTOMS Hypertension does not usually cause signs or symptoms. Extremely high blood pressure (hypertensive crisis) may cause headache, anxiety, shortness of breath, and nosebleed. DIAGNOSIS To check if you have hypertension, your health care provider will measure your blood pressure while you are seated, with your arm held at the level of your heart. It should be measured at least twice using the same arm. Certain conditions can cause a difference in blood pressure between your right and left arms. A blood pressure reading that is higher than normal on one occasion does not mean that you need treatment. If it is not clear whether you have high blood pressure, you may be asked to return on a different day to have your blood pressure checked again. Or, you may be asked to monitor your blood pressure at home for 1 or more weeks. TREATMENT Treating high blood pressure includes making lifestyle changes and possibly taking medicine. Living a healthy lifestyle can help lower high blood pressure. You may need to change some of your habits. Lifestyle changes may include:  Following the DASH diet. This diet is high in fruits, vegetables, and whole grains. It is low in salt, red meat, and added sugars.  Keep your sodium intake below 2,300 mg per  day.  Getting at least 30-45 minutes of aerobic exercise at least 4 times per week.  Losing weight if necessary.  Not smoking.  Limiting alcoholic beverages.  Learning ways to reduce stress. Your health care provider may prescribe medicine if lifestyle changes are not enough to get  your blood pressure under control, and if one of the following is true:  You are 2418-57 years of age and your systolic blood pressure is above 140.  You are 57 years of age or older, and your systolic blood pressure is above 150.  Your diastolic blood pressure is above 90.  You have diabetes, and your systolic blood pressure is over 140 or your diastolic blood pressure is over 90.  You have kidney disease and your blood pressure is above 140/90.  You have heart disease and your blood pressure is above 140/90. Your personal target blood pressure may vary depending on your medical conditions, your age, and other factors. HOME CARE INSTRUCTIONS  Have your blood pressure rechecked as directed by your health care provider.   Take medicines only as directed by your health care provider. Follow the directions carefully. Blood pressure medicines must be taken as prescribed. The medicine does not work as well when you skip doses. Skipping doses also puts you at risk for problems.  Do not smoke.   Monitor your blood pressure at home as directed by your health care provider. SEEK MEDICAL CARE IF:   You think you are having a reaction to medicines taken.  You have recurrent headaches or feel dizzy.  You have swelling in your ankles.  You have trouble with your vision. SEEK IMMEDIATE MEDICAL CARE IF:  You develop a severe headache or confusion.  You have unusual weakness, numbness, or feel faint.  You have severe chest or abdominal pain.  You vomit repeatedly.  You have trouble breathing. MAKE SURE YOU:   Understand these instructions.  Will watch your condition.  Will get help right away if you are not doing well or get worse.   This information is not intended to replace advice given to you by your health care provider. Make sure you discuss any questions you have with your health care provider.   Document Released: 11/21/2005 Document Revised: 04/07/2015 Document  Reviewed: 09/13/2013 Elsevier Interactive Patient Education 2016 Elsevier Inc.  DASH Eating Plan DASH stands for "Dietary Approaches to Stop Hypertension." The DASH eating plan is a healthy eating plan that has been shown to reduce high blood pressure (hypertension). Additional health benefits may include reducing the risk of type 2 diabetes mellitus, heart disease, and stroke. The DASH eating plan may also help with weight loss. WHAT DO I NEED TO KNOW ABOUT THE DASH EATING PLAN? For the DASH eating plan, you will follow these general guidelines:  Choose foods with a percent daily value for sodium of less than 5% (as listed on the food label).  Use salt-free seasonings or herbs instead of table salt or sea salt.  Check with your health care provider or pharmacist before using salt substitutes.  Eat lower-sodium products, often labeled as "lower sodium" or "no salt added."  Eat fresh foods.  Eat more vegetables, fruits, and low-fat dairy products.  Choose whole grains. Look for the word "whole" as the first word in the ingredient list.  Choose fish and skinless chicken or [place] more often than red meat. Limit fish, poultry, and meat to 6 oz (170 g) each day.  Limit sweets, desserts, sugars, and sugary drinks.  Choose heart-healthy fats.  Limit cheese to 1 oz (28 g) per day.  Eat more home-cooked food and less restaurant, buffet, and fast food.  Limit fried foods.  Cook foods using methods other than frying.  Limit canned vegetables. If you do use them, rinse them well to decrease the sodium.  When eating at a restaurant, ask that your food be prepared with less salt, or no salt if possible. WHAT FOODS CAN I EAT? Seek help from a dietitian for individual calorie needs. Grains Whole grain or whole wheat bread. Brown rice. Whole grain or whole wheat pasta. Quinoa, bulgur, and whole grain cereals. Low-sodium cereals. Corn or whole wheat flour tortillas. Whole grain cornbread.  Whole grain crackers. Low-sodium crackers. Vegetables Fresh or frozen vegetables (raw, steamed, roasted, or grilled). Low-sodium or reduced-sodium tomato and vegetable juices. Low-sodium or reduced-sodium tomato sauce and paste. Low-sodium or reduced-sodium canned vegetables.  Fruits All fresh, canned (in natural juice), or frozen fruits. Meat and Other Protein Products Ground beef (85% or leaner), grass-fed beef, or beef trimmed of fat. Skinless chicken or [place]. Ground chicken or [place]. Pork trimmed of fat. All fish and seafood. Eggs. Dried beans, peas, or lentils. Unsalted nuts and seeds. Unsalted canned beans. Dairy Low-fat dairy products, such as skim or 1% milk, 2% or reduced-fat cheeses, low-fat ricotta or cottage cheese, or plain low-fat yogurt. Low-sodium or reduced-sodium cheeses. Fats and Oils Tub margarines without trans fats. Light or reduced-fat mayonnaise and salad dressings (reduced sodium). Avocado. Safflower, olive, or canola oils. Natural peanut or almond butter. Other Unsalted popcorn and pretzels. The items listed above may not be a complete list of recommended foods or beverages. Contact your dietitian for more options. WHAT FOODS ARE NOT RECOMMENDED? Grains White bread. White pasta. White rice. Refined cornbread. Bagels and croissants. Crackers that contain trans fat. Vegetables Creamed or fried vegetables. Vegetables in a cheese sauce. Regular canned vegetables. Regular canned tomato sauce and paste. Regular tomato and vegetable juices. Fruits Dried fruits. Canned fruit in light or heavy syrup. Fruit juice. Meat and Other Protein Products Fatty cuts of meat. Ribs, chicken wings, bacon, sausage, bologna, salami, chitterlings, fatback, hot dogs, bratwurst, and packaged luncheon meats. Salted nuts and seeds. Canned beans with salt. Dairy Whole or 2% milk, cream, half-and-half, and cream cheese. Whole-fat or sweetened yogurt. Full-fat cheeses or blue cheese. Nondairy  creamers and whipped toppings. Processed cheese, cheese spreads, or cheese curds. Condiments Onion and garlic salt, seasoned salt, table salt, and sea salt. Canned and packaged gravies. Worcestershire sauce. Tartar sauce. Barbecue sauce. Teriyaki sauce. Soy sauce, including reduced sodium. Steak sauce. Fish sauce. Oyster sauce. Cocktail sauce. Horseradish. Ketchup and mustard. Meat flavorings and tenderizers. Bouillon cubes. Hot sauce. Tabasco sauce. Marinades. Taco seasonings. Relishes. Fats and Oils Butter, stick margarine, lard, shortening, ghee, and bacon fat. Coconut, palm kernel, or palm oils. Regular salad dressings. Other Pickles and olives. Salted popcorn and pretzels. The items listed above may not be a complete list of foods and beverages to avoid. Contact your dietitian for more information. WHERE CAN I FIND MORE INFORMATION? National Heart, Lung, and Blood Institute: [employer]   This information is not intended to replace advice given to you by your health care provider. Make sure you discuss any questions you have with your health care provider.   Document Released: 11/10/2011 Document Revised: 12/12/2014 Document Reviewed: 09/25/2013 Elsevier Interactive Patient Education [employer]

## 2016-03-15 NOTE — Telephone Encounter (Signed)
Please inform patient that his kidney, liver and electrolyte function is all within the normal ranges. We will plan to follow up as scheduled.

## 2016-03-15 NOTE — Assessment & Plan Note (Signed)
Hypertension is uncontrolled and above goal of 140/90 with no medication management. Start hydrochlorothiazide. Denies symptoms of end organ damage. Obtain basic metabolic panel to check kidney function and electrolytes. Encouraged to monitor blood pressure at home. Information on DASH eating plan provided. Follow-up in one month.

## 2016-03-15 NOTE — Assessment & Plan Note (Signed)
Continues to smoke and indicates he has reduce the amount of cigarettes but has maintained a proximally 6 per day. Not ready to quit at this time. Discussed importance of tobacco cessation for decreasing blood pressure and reducing risk for cardiovascular and respiratory chronic diseases in the future.

## 2016-03-15 NOTE — Progress Notes (Signed)
Subjective:    Patient ID: Todd Friedman, male    DOB: [birth year], 55 y.o.   MRN: [id number]  Chief Complaint  Patient presents with  . Establish Care    has IBS and wants to have a colonoscopy, BP has been high    HPI:  Todd Friedman is a 55 y.o. male who  has a past medical history of Hypertension; High cholesterol; Abdominal cramping, generalized; IBS (irritable bowel syndrome); Irritable bowel syndrome (IBS); and Alcoholism (HCC). and presents today for an office visit to establish care.    1.) Hypertension - Previously diagnosed with hypertension and is currently managed with lifestyle. Was previously prescribed hydrochlorothiazide which worked well for him and was stopped secondary to financial and insurance issues. Denies any symptoms of end organ damage.   BP Readings from Last 3 Encounters:  03/15/16 190/100  11/19/15 164/79  06/21/15 132/87   2.) IBS-D - Previously diagnosed with IBS which favors the diarrhea aspect. Describes loose stools and abdominal cramping that can vary in frequency and intensity. Modified by Bentyl tablets which helps with the spasms. He has made changes in his diet which have somewhat helped as well. Experiencing the associated symptom of mild abdominal cramping and discomfort. Denies any nausea, vomiting or constipation. He would also like a referral to gastroenterology for a colonoscopy which has been overdue. He has had previous polyps in the past. Denis blood in stool.   3.) Stitches in left index finger - Experienced the associated symptom of a laceration located on the tip of his left index finger has been going on for 8 days following installing a door. He was seen at Urgent Care outside of the system and received several stiches and placed on antibiotics. Reports taking the antibiotics as prescribed without adverse side effects. Denies fevers, redness, swelling or other signs of infection.   No Known Allergies   Outpatient Prescriptions Prior  to Visit  Medication Sig Dispense Refill  . ibuprofen (ADVIL,MOTRIN) 800 MG tablet Take 1 tablet (800 mg total) by mouth every 6 (six) hours as needed. 30 tablet 0  . metaxalone (SKELAXIN) 800 MG tablet Take 1 tablet (800 mg total) by mouth 3 (three) times daily. 21 tablet 0  . traMADol (ULTRAM) 50 MG tablet 1-2 tabs po q 6 hr prn pain Maximum dose= 8 tablets per day 20 tablet 0  . traZODone (DESYREL) 50 MG tablet Take 1 tablet (50 mg total) by mouth at bedtime. 30 tablet 2   No facility-administered medications prior to visit.     Past Medical History  Diagnosis Date  . Hypertension   . High cholesterol   . Abdominal cramping, generalized   . IBS (irritable bowel syndrome)   . Irritable bowel syndrome (IBS)   . Alcoholism (HCC)      History reviewed. No pertinent past surgical history.   Family History  Problem Relation Age of Onset  . Hypertension Mother   . Lung cancer Mother   . Leukemia Father   . Hypertension Father   . Leukemia Other      Social History   Social History  . Marital Status: Married    Spouse Name: N/A  . Number of Children: 1  . Years of Education: 16   Occupational History  . Not on file.   Social History Main Topics  . Smoking status: Current Every Day Smoker -- 0.50 packs/day for 26 years    Types: Cigarettes  . Smokeless tobacco: Never Used  . Alcohol  Use: No     Comment: Reports being sober  . Drug Use: Yes    Special: Marijuana  . Sexual Activity: Not on file   Other Topics Concern  . Not on file   Social History Narrative   Fun: Ride horses, play golf      Review of Systems  Constitutional: Negative for fever and chills.  Eyes:       Negative for changes in vision.  Respiratory: Negative for chest tightness and shortness of breath.   Cardiovascular: Negative for chest pain, palpitations and leg swelling.  Gastrointestinal: Positive for abdominal pain and diarrhea. Negative for nausea, vomiting, constipation and blood  in stool.  Neurological: Negative for headaches.      Objective:    BP 190/100 mmHg  Pulse 79  Temp(Src) 98.7 F (37.1 C) (Oral)  Resp 16  Ht  (1.727 m)  Wt 148 lb (67.132 kg)  BMI 22.51 kg/m2  SpO2 98% Nursing note and vital signs reviewed.  Physical Exam  Constitutional: He is oriented to person, place, and time. He appears well-developed and well-nourished. No distress.  Cardiovascular: Normal rate, regular rhythm, normal heart sounds and intact distal pulses.   Pulmonary/Chest: Effort normal and breath sounds normal.  Abdominal: Soft. Bowel sounds are normal. He exhibits no distension and no mass. There is no tenderness. There is no rebound and no guarding.  Neurological: He is alert and oriented to person, place, and time.  Skin: Skin is warm and dry.  Psychiatric: He has a normal mood and affect. His behavior is normal. Judgment and thought content normal.       Assessment & Plan:   Problem List Items Addressed This Visit      Cardiovascular and Mediastinum   Essential hypertension - Primary    Hypertension is uncontrolled and above goal of 140/90 with no medication management. Start hydrochlorothiazide. Denies symptoms of end organ damage. Obtain basic metabolic panel to check kidney function and electrolytes. Encouraged to monitor blood pressure at home. Information on DASH eating plan provided. Follow-up in one month.      Relevant Medications   hydrochlorothiazide (HYDRODIURIL) 25 MG tablet   Other Relevant Orders   Comprehensive metabolic panel     Digestive   Irritable bowel syndrome    Symptoms and exam consistent with irritable bowel syndrome favoring diarrhea. Start Bentyl. Follow-up if symptoms worsen or do not improve with medication. Consider additional medication such as Viberzi and possible referral to GI if no improvement.       Relevant Medications   dicyclomine (BENTYL) 20 MG tablet     Other   Tobacco use disorder    Continues to smoke  and indicates he has reduce the amount of cigarettes but has maintained a proximally 6 per day. Not ready to quit at this time. Discussed importance of tobacco cessation for decreasing blood pressure and reducing risk for cardiovascular and respiratory chronic diseases in the future.      Laceration of finger    Laceration of finger appears well-healed with no evidence of infection. Sutures removed without difficulty or complication. Continue basic wound care with soap and water. Follow-up if symptoms of infection develop.       Other Visit Diagnoses    Screening for colon cancer        Relevant Orders    Ambulatory referral to Gastroenterology        I have discontinued Mr. Marquard's traZODone, metaxalone, ibuprofen, and traMADol. I am also having  him start on hydrochlorothiazide and dicyclomine.   Meds ordered this encounter  Medications  . hydrochlorothiazide (HYDRODIURIL) 25 MG tablet    Sig: Take 1 tablet (25 mg total) by mouth daily.    Dispense:  30 tablet    Refill:  1    Order Specific Question:  Supervising Provider    Answer:  Hillard DankerRAWFORD, ELIZABETH A [[id number]]  . dicyclomine (BENTYL) 20 MG tablet    Sig: Take 1 tablet (20 mg total) by mouth 3 (three) times daily before meals.    Dispense:  90 tablet    Refill:  0    Order Specific Question:  Supervising Provider    Answer:  Hillard DankerRAWFORD, ELIZABETH A [[id number]]     Follow-up: Return in about 1 month (around 04/14/2016) for Blood pressure and IBS.  Jeanine Luzalone, Merie Wulf, FNP

## 2016-03-15 NOTE — Assessment & Plan Note (Signed)
Symptoms and exam consistent with irritable bowel syndrome favoring diarrhea. Start Bentyl. Follow-up if symptoms worsen or do not improve with medication. Consider additional medication such as Viberzi and possible referral to GI if no improvement.

## 2016-03-16 NOTE — Telephone Encounter (Signed)
Pt's wife aware of results

## 2016-04-07 ENCOUNTER — Encounter: Payer: Self-pay | Admitting: Family

## 2016-04-12 ENCOUNTER — Encounter: Payer: Self-pay | Admitting: Family

## 2016-04-12 ENCOUNTER — Ambulatory Visit (INDEPENDENT_AMBULATORY_CARE_PROVIDER_SITE_OTHER): Payer: BLUE CROSS/BLUE SHIELD | Admitting: Family

## 2016-04-12 ENCOUNTER — Ambulatory Visit: Payer: Self-pay | Admitting: Family

## 2016-04-12 VITALS — BP 170/100 | HR 98 | Temp 98.2°F | Resp 16 | Ht 68.0 in | Wt 147.0 lb

## 2016-04-12 DIAGNOSIS — I10 Essential (primary) hypertension: Secondary | ICD-10-CM | POA: Diagnosis not present

## 2016-04-12 MED ORDER — HYDROCHLOROTHIAZIDE 25 MG PO TABS: 25.0000 mg | ORAL_TABLET | Freq: Every day | ORAL | Status: DC

## 2016-04-12 MED ORDER — METOPROLOL SUCCINATE ER 50 MG PO TB24: 50.0000 mg | ORAL_TABLET | Freq: Every day | ORAL | Status: DC

## 2016-04-12 NOTE — Progress Notes (Signed)
Pre visit review using our clinic review tool, if applicable. No additional management support is needed unless otherwise documented below in the visit note. 

## 2016-04-12 NOTE — Progress Notes (Signed)
Subjective:    Patient ID: Todd Friedman, male    DOB: October 06, 1961, 55 y.o.   MRN: [id number]  Chief Complaint  Patient presents with  . Follow-up    hypertension    HPI:  Todd Friedman is a 55 y.o. male who  has a past medical history of Hypertension; High cholesterol; Abdominal cramping, generalized; IBS (irritable bowel syndrome); Irritable bowel syndrome (IBS); and Alcoholism (HCC). and presents today for an office follow up.  1.) Hypertension - Currently maintained on hydrochlorothiazide. Reports taking the medication as prescribed and denies adverse side effects. Notes home blood pressure have been a little better than today. Endorses that his headaches are improved and denies additional symptoms of end organ damage.   BP Readings from Last 3 Encounters:  04/12/16 170/100  03/15/16 190/100  11/19/15 164/79    No Known Allergies   Current Outpatient Prescriptions on File Prior to Visit  Medication Sig Dispense Refill  . dicyclomine (BENTYL) 20 MG tablet Take 1 tablet (20 mg total) by mouth 3 (three) times daily before meals. 90 tablet 0   No current facility-administered medications on file prior to visit.    Review of Systems  Constitutional: Negative for fever and chills.  Eyes:       Negative for changes in vision  Respiratory: Negative for cough, chest tightness, shortness of breath and wheezing.   Cardiovascular: Negative for chest pain, palpitations and leg swelling.  Neurological: Negative for dizziness, weakness, light-headedness and headaches.      Objective:    BP 170/100 mmHg  Pulse 98  Temp(Src) 98.2 F (36.8 C) (Oral)  Resp 16  Ht  (1.727 m)  Wt 147 lb (66.679 kg)  BMI 22.36 kg/m2  SpO2 98% Nursing note and vital signs reviewed.  Physical Exam  Constitutional: He is oriented to person, place, and time. He appears well-developed and well-nourished. No distress.  Cardiovascular: Normal rate, regular rhythm, normal heart sounds and intact  distal pulses.   Pulmonary/Chest: Effort normal and breath sounds normal.  Neurological: He is alert and oriented to person, place, and time.  Skin: Skin is warm and dry.  Psychiatric: He has a normal mood and affect. His behavior is normal. Judgment and thought content normal.       Assessment & Plan:   Problem List Items Addressed This Visit      Cardiovascular and Mediastinum   Essential hypertension - Primary    Blood pressure is improved however still remains significantly elevated above goal 140/90 with current regimen. No symptoms of end organ damage present. Continue current dosage of hydrochlorothiazide and start metoprolol. Follow-up in 3 weeks for nurse visit to check blood pressure.      Relevant Medications   metoprolol succinate (TOPROL-XL) 50 MG 24 hr tablet   hydrochlorothiazide (HYDRODIURIL) 25 MG tablet       I am having Mr. Munyan start on metoprolol succinate. I am also having him maintain his dicyclomine and hydrochlorothiazide.   Meds ordered this encounter  Medications  . metoprolol succinate (TOPROL-XL) 50 MG 24 hr tablet    Sig: Take 1 tablet (50 mg total) by mouth daily. Take with or immediately following a meal.    Dispense:  90 tablet    Refill:  0    Order Specific Question:  Supervising Provider    Answer:  Hillard Danker A [[id number]]  . hydrochlorothiazide (HYDRODIURIL) 25 MG tablet    Sig: Take 1 tablet (25 mg total) by mouth daily.  Dispense:  90 tablet    Refill:  0    Order Specific Question:  Supervising Provider    Answer:  Hillard DankerRAWFORD, ELIZABETH A [[id number]]     Follow-up: Return in about 3 weeks (around 05/03/2016) for Nurse visit for BP.  Jeanine Luzalone, Gregory, FNP

## 2016-04-12 NOTE — Patient Instructions (Signed)
Thank you for choosing [facility].  Summary/Instructions:  Please continue to take your medications as prescribed.   Your prescription(s) have been submitted to your pharmacy or been printed and provided for you. Please take as directed and contact our office if you believe you are having problem(s) with the medication(s) or have any questions.  If your symptoms worsen or fail to improve, please contact our office for further instruction, or in case of emergency go directly to the emergency room at the closest medical facility.     

## 2016-04-12 NOTE — Assessment & Plan Note (Signed)
Blood pressure is improved however still remains significantly elevated above goal 140/90 with current regimen. No symptoms of end organ damage present. Continue current dosage of hydrochlorothiazide and start metoprolol. Follow-up in 3 weeks for nurse visit to check blood pressure.

## 2016-05-30 ENCOUNTER — Encounter (HOSPITAL_COMMUNITY): Payer: Self-pay

## 2016-05-30 ENCOUNTER — Emergency Department (HOSPITAL_COMMUNITY)
Admission: EM | Admit: 2016-05-30 | Discharge: 2016-05-30 | Disposition: A | Discharge status: Home / Self Care | Payer: BLUE CROSS/BLUE SHIELD | Attending: Emergency Medicine | Admitting: Emergency Medicine

## 2016-05-30 DIAGNOSIS — M545 Low back pain, unspecified: Secondary | ICD-10-CM

## 2016-05-30 DIAGNOSIS — F17210 Nicotine dependence, cigarettes, uncomplicated: Secondary | ICD-10-CM | POA: Insufficient documentation

## 2016-05-30 DIAGNOSIS — Z79899 Other long term (current) drug therapy: Secondary | ICD-10-CM | POA: Diagnosis not present

## 2016-05-30 DIAGNOSIS — I10 Essential (primary) hypertension: Secondary | ICD-10-CM | POA: Diagnosis not present

## 2016-05-30 HISTORY — DX: Other cervical disc displacement, unspecified cervical region: M50.20

## 2016-05-30 MED ORDER — NAPROXEN 250 MG PO TABS: 250.0000 mg | ORAL_TABLET | Freq: Two times a day (BID) | ORAL | Status: DC

## 2016-05-30 MED ORDER — AMLODIPINE BESYLATE 2.5 MG PO TABS: 2.5000 mg | ORAL_TABLET | Freq: Every day | ORAL | Status: AC

## 2016-05-30 MED ORDER — OXYCODONE-ACETAMINOPHEN 5-325 MG PO TABS
ORAL_TABLET | ORAL | Status: AC
  Filled 2016-05-30: qty 1

## 2016-05-30 MED ORDER — ACETAMINOPHEN 500 MG PO TABS: 500.0000 mg | ORAL_TABLET | Freq: Four times a day (QID) | ORAL | Status: AC

## 2016-05-30 MED ORDER — OXYCODONE-ACETAMINOPHEN 5-325 MG PO TABS
1.0000 | ORAL_TABLET | ORAL | Status: DC | PRN
  Administered 2016-05-30: 1 via ORAL

## 2016-05-30 MED ORDER — ACETAMINOPHEN 500 MG PO TABS
1000.0000 mg | ORAL_TABLET | Freq: Once | ORAL | Status: AC
  Administered 2016-05-30: 1000 mg via ORAL
  Filled 2016-05-30: qty 2

## 2016-05-30 MED ORDER — NAPROXEN 250 MG PO TABS
375.0000 mg | ORAL_TABLET | Freq: Once | ORAL | Status: AC
  Administered 2016-05-30: 375 mg via ORAL
  Filled 2016-05-30: qty 2

## 2016-05-30 NOTE — ED Notes (Addendum)
Per Pt, Pt was getting out of his car that sits really low when he started to have pressure lower back pain with muscle spasms. Pain has continued. Denies any numbness or tingling, but reports bilateral leg soreness. No incontinence noted. Reports bowel movements every two days

## 2016-05-30 NOTE — ED Provider Notes (Signed)
CSN: [id number]     Arrival date & time 05/30/16  1339 History   First MD Initiated Contact with Patient 05/30/16 1948     Chief Complaint  Patient presents with  . Back Pain   (Consider location/radiation/quality/duration/timing/severity/associated sxs/prior Treatment) HPI Patient states that he was getting out of his car 2 weeks ago when he suddenly felt some pressure and spasm to his bilateral lower back that has not resolved since. He states the pain is an 8 out of 10, reproduce with movement. No recent trauma. No history kidney stones no hematuria, no dysuria, no fevers. Has had a CT in the past in our records with no history of AAA. He is a smoker and has had an 8 pound weight loss over the last few months due to decreased appetite but no cancer. He states the pain is not associated with any numbness or weakness and he is able to ambulate. He notes that doing physical therapy exercises at home make the pain improved. He has been attempting topical treatments with mild improvement. He has not taken any medications by mouth. He did receive some Percocet in triage and states that helped his pain some. He denies any urinary incontinence, saddle anesthesia, fevers chills. He has not been lightheaded or with chest pain. No rashes.  Past Medical History  Diagnosis Date  . Hypertension   . High cholesterol   . Abdominal cramping, generalized   . IBS (irritable bowel syndrome)   . Irritable bowel syndrome (IBS)   . Alcoholism (HCC)   . Herniated disc, cervical    History reviewed. No pertinent past surgical history. Family History  Problem Relation Age of Onset  . Hypertension Mother   . Lung cancer Mother   . Leukemia Father   . Hypertension Father   . Leukemia Other    Social History  Substance Use Topics  . Smoking status: Current Every Day Smoker -- 0.50 packs/day for 26 years    Types: Cigarettes  . Smokeless tobacco: Never Used  . Alcohol Use: Yes     Comment: Reports being  sober, Socially     Review of Systems  Constitutional: Negative for fever.  Allergic/Immunologic: Negative for immunocompromised state.  All other systems reviewed and are negative.     Allergies  Review of patient's allergies indicates no known allergies.  Home Medications   Prior to Admission medications   Medication Sig Start Date End Date Taking? Authorizing Provider  dicyclomine (BENTYL) 20 MG tablet Take 1 tablet (20 mg total) by mouth 3 (three) times daily before meals. Patient taking differently: Take 20 mg by mouth 2 (two) times daily before a meal.  03/15/16  Yes Veryl SpeakGregory D Calone, FNP  hydrochlorothiazide (HYDRODIURIL) 25 MG tablet Take 1 tablet (25 mg total) by mouth daily. 04/12/16  Yes Veryl SpeakGregory D Calone, FNP  OVER THE COUNTER MEDICATION Back Aid Max: Take 1 tablet by mouth every 6 hours as needed for pain   Yes Historical Provider, MD  acetaminophen (TYLENOL) 500 MG tablet Take 1 tablet (500 mg total) by mouth every 6 (six) hours. 05/30/16   Sidney AceAlison Charruf Lulia Schriner, MD  amLODipine (NORVASC) 2.5 MG tablet Take 1 tablet (2.5 mg total) by mouth daily. 05/30/16   Sidney AceAlison Charruf Kona Yusuf, MD  metoprolol succinate (TOPROL-XL) 50 MG 24 hr tablet Take 1 tablet (50 mg total) by mouth daily. Take with or immediately following a meal. 04/12/16   Veryl SpeakGregory D Calone, FNP  naproxen (NAPROSYN) 250 MG tablet Take 1 tablet (  250 mg total) by mouth 2 (two) times daily. 05/30/16   Sidney AceAlison Charruf Rogan Wigley, MD   BP 184/99 mmHg  Pulse 74  Temp(Src) 98.5 F (36.9 C) (Oral)  Resp 18  Ht 5\' 8"  (1.727 m)  Wt 64.365 kg  BMI 21.58 kg/m2  SpO2 94% Physical Exam  Constitutional: He appears well-developed and well-nourished. No distress.  HENT:  Head: Normocephalic and atraumatic.  Left Ear: External ear normal.  Eyes: Conjunctivae are normal. Pupils are equal, round, and reactive to light. Right eye exhibits no discharge. Left eye exhibits no discharge.  Neck: Normal range of motion. Neck supple.  Cardiovascular:  Normal rate and regular rhythm.   No murmur heard. Pulmonary/Chest: Effort normal and breath sounds normal. No respiratory distress.  Abdominal: Soft. Bowel sounds are normal. He exhibits no distension and no mass. There is no tenderness. There is no rebound and no guarding.  Musculoskeletal: He exhibits no edema.  Normal sensation  Neurological: He is alert.  5 out of 5 strength in lower extremities, patient and able to and bili without difficulty, normal sensation of lower extremities  Patient shows me his physical therapy exercises that he does and he appears more relieved when he does them.  Gowing from sitting to standing reproduces patient's pain when he flexes his back forward  Patient has no C-spine, T-spine, L-spine tenderness  Skin: Skin is warm. He is not diaphoretic.  Psychiatric: He has a normal mood and affect.  Nursing note and vitals reviewed.   ED Course  Procedures (including critical care time) Labs Review Labs Reviewed - No data to display  Imaging Review No results found. I have personally reviewed and evaluated these images and lab results as part of my medical decision-making.   EKG Interpretation None      MDM   Final diagnoses:  Essential hypertension  Bilateral low back pain without sciatica    Doubt cauda equina, doubt AAA, doubt aortic dissection, doubt renal stone. Patient well-appearing, normal neurological exam, benign abdominal exam. Clear muscle skeletal trigger and tender to exam. Strict return precautions given. Symptomatic management discussed. Patient will follow up with primary care provider. Discharged in good condition.    Sidney AceAlison Charruf Kaysie Michelini, MD 05/31/16 0004  Eber HongBrian Miller, MD 05/31/16 [id number]

## 2016-05-30 NOTE — Discharge Instructions (Signed)

## 2018-03-01 ENCOUNTER — Ambulatory Visit (HOSPITAL_COMMUNITY)
Admission: EM | Admit: 2018-03-01 | Discharge: 2018-03-01 | Disposition: A | Discharge status: Home / Self Care | Payer: BLUE CROSS/BLUE SHIELD | Attending: Emergency Medicine | Admitting: Emergency Medicine

## 2018-03-01 ENCOUNTER — Other Ambulatory Visit: Payer: Self-pay

## 2018-03-01 ENCOUNTER — Encounter (HOSPITAL_COMMUNITY): Payer: Self-pay | Admitting: Emergency Medicine

## 2018-03-01 DIAGNOSIS — I10 Essential (primary) hypertension: Secondary | ICD-10-CM

## 2018-03-01 DIAGNOSIS — R202 Paresthesia of skin: Secondary | ICD-10-CM

## 2018-03-01 MED ORDER — HYDROCHLOROTHIAZIDE 25 MG PO TABS: 25.0000 mg | ORAL_TABLET | Freq: Every day | ORAL | 0 refills | Status: AC

## 2018-03-01 MED ORDER — PREDNISONE 50 MG PO TABS: 50.0000 mg | ORAL_TABLET | Freq: Every day | ORAL | 0 refills | Status: AC

## 2018-03-01 MED ORDER — GABAPENTIN 100 MG PO CAPS: 100.0000 mg | ORAL_CAPSULE | Freq: Three times a day (TID) | ORAL | 0 refills | Status: AC

## 2018-03-01 MED ORDER — TRAMADOL HCL 50 MG PO TABS: 50.0000 mg | ORAL_TABLET | Freq: Four times a day (QID) | ORAL | 0 refills | Status: AC | PRN

## 2018-03-01 NOTE — Discharge Instructions (Signed)
Please take prednisone daily for the next 5 days.  Please begin gabapentin 3 times a day.  You may use tramadol for severe pain only.  This will cause sedation, please only use at nighttime when you will not need to drive or operate machinery.  Please restart your hydrochlorothiazide.  Please monitor your blood pressure at home.  Please work on getting recently established with a primary care provider.

## 2018-03-01 NOTE — ED Provider Notes (Signed)
[facility]    CSN: [id number] Arrival date & time: 03/01/18  1215     History   Chief Complaint Chief Complaint  Patient presents with  . Numbness    HPI Todd Friedman is a 57 y.o. male history of hypertension presenting today with numbness.  He has had numbness in his fingers, toes and lips on the right side of his face for the past 2-1/2 months.  He correlates an injury at work, he works as a Metallurgist.  As he was pounding concrete thinker and to the ground he all of a sudden had numbness that radiated up into his right arm, face and lips as well as his foot.  Some numbness has resolved after 4-5 weeks.  But he has had persistent numbness in his right fingers, right lips, right toes.  He also notes that since he has had and not on the palmar aspect of his hand.  The numbness is more so a tingling and pins and needle sensation versus true numbness and lack of sensation.  He has no pain with movement or lack of range of motion.  He states that he feels everything he touches it is just a tingling sensation.  HPI  Past Medical History:  Diagnosis Date  . Abdominal cramping, generalized   . Alcoholism (HCC)   . Herniated disc, cervical   . High cholesterol   . Hypertension   . IBS (irritable bowel syndrome)   . Irritable bowel syndrome (IBS)     Patient Active Problem List   Diagnosis Date Noted  . Essential hypertension 03/15/2016  . Irritable bowel syndrome 03/15/2016  . Tobacco use disorder 03/15/2016  . Laceration of finger 03/15/2016  . Delirium tremens (HCC) 06/19/2015  . Hypokalemia 06/19/2015  . Hypomagnesemia 06/19/2015  . Alcohol withdrawal (HCC) 06/18/2015  . Hypercalcemia 06/18/2015  . AKI (acute kidney injury) (HCC) 06/18/2015  . Cocaine abuse with cocaine-induced mood disorder (HCC) 06/15/2015  . Alcohol dependence (HCC) 09/18/2014  . Substance induced mood disorder (HCC) 09/18/2014    History reviewed. No pertinent surgical  history.     Home Medications    Prior to Admission medications   Medication Sig Start Date End Date Taking? Authorizing Provider  acetaminophen (TYLENOL) 500 MG tablet Take 1 tablet (500 mg total) by mouth every 6 (six) hours. 05/30/16   Sidney Ace, MD  amLODipine (NORVASC) 2.5 MG tablet Take 1 tablet (2.5 mg total) by mouth daily. 05/30/16   Sidney Ace, MD  dicyclomine (BENTYL) 20 MG tablet Take 1 tablet (20 mg total) by mouth 3 (three) times daily before meals. Patient taking differently: Take 20 mg by mouth 2 (two) times daily before a meal.  03/15/16   Calone, Tama Headings, FNP  gabapentin (NEURONTIN) 100 MG capsule Take 1 capsule (100 mg total) by mouth 3 (three) times daily for 14 days. 03/01/18 03/15/18  Wieters, Hallie C, PA-C  hydrochlorothiazide (HYDRODIURIL) 25 MG tablet Take 1 tablet (25 mg total) by mouth daily. 03/01/18   Wieters, Hallie C, PA-C  OVER THE COUNTER MEDICATION Back Aid Max: Take 1 tablet by mouth every 6 hours as needed for pain    [provider]  predniSONE (DELTASONE) 50 MG tablet Take 1 tablet (50 mg total) by mouth daily for 5 days. 03/01/18 03/06/18  Wieters, Hallie C, PA-C  traMADol (ULTRAM) 50 MG tablet Take 1 tablet (50 mg total) by mouth every 6 (six) hours as needed for up to 3 days. 03/01/18  03/04/18  Wieters, Junius Creamer, PA-C    Family History Family History  Problem Relation Age of Onset  . Hypertension Mother   . Lung cancer Mother   . Leukemia Father   . Hypertension Father   . Leukemia Other     Social History Social History   Tobacco Use  . Smoking status: Current Every Day Smoker    Packs/day: 0.50    Years: 26.00    Pack years: 13.00    Types: Cigarettes  . Smokeless tobacco: Never Used  Substance Use Topics  . Alcohol use: Yes    Comment: Reports being sober, Socially   . Drug use: Yes    Types: Marijuana     Allergies   Patient has no known allergies.   Review of Systems Review of Systems    Constitutional: Negative for fatigue and fever.  Eyes: Negative for visual disturbance.  Respiratory: Negative for shortness of breath.   Cardiovascular: Negative for chest pain.  Gastrointestinal: Negative for nausea and vomiting.  Musculoskeletal: Negative for myalgias.  Skin: Negative for color change, pallor and wound.  Neurological: Negative for dizziness, seizures, syncope, weakness, light-headedness and headaches.       Paresthesias     Physical Exam Triage Vital Signs ED Triage Vitals  Enc Vitals Group     BP 03/01/18 1251 (!) 170/95     Pulse Rate 03/01/18 1251 78     Resp --      Temp 03/01/18 1251 98.6 F (37 C)     Temp Source 03/01/18 1251 Oral     SpO2 03/01/18 1251 98 %     Weight --      Height --      Head Circumference --      Peak Flow --      Pain Score 03/01/18 1249 0     Pain Loc --      Pain Edu? --      Excl. in GC? --    No data found.  Updated Vital Signs BP (!) 170/95 (BP Location: Left Arm)   Pulse 78   Temp 98.6 F (37 C) (Oral)   SpO2 98%   Visual Acuity Right Eye Distance:   Left Eye Distance:   Bilateral Distance:    Right Eye Near:   Left Eye Near:    Bilateral Near:     Physical Exam  Constitutional: He appears well-developed and well-nourished.  HENT:  Head: Normocephalic and atraumatic.  Eyes: Conjunctivae are normal.  Neck: Neck supple.  Cardiovascular: Normal rate and regular rhythm.  No murmur heard. Pulmonary/Chest: Effort normal and breath sounds normal. No respiratory distress.  Abdominal: Soft. There is no tenderness.  Musculoskeletal: He exhibits no edema.  Neurological: He is alert.  Patient has full sensation intact, normal sharp and dull testing, patient able to distinguish sensations.  Full active range of motion.  No focal neuro deficits.  Cranial nerves II through XII grossly intact, normal coordination, normal speech, strength 5/5 and equal bilaterally at shoulders and hips.  Skin: Skin is warm and  dry.  Psychiatric: He has a normal mood and affect.  Nursing note and vitals reviewed.    UC Treatments / Results  Labs (all labs ordered are listed, but only abnormal results are displayed) Labs Reviewed - No data to display  EKG None Radiology No results found.  Procedures Procedures (including critical care time)  Medications Ordered in UC Medications - No data to display   Initial Impression /  Assessment and Plan / UC Course  I have reviewed the triage vital signs and the nursing notes.  Pertinent labs & imaging results that were available during my care of the patient were reviewed by me and considered in my medical decision making (see chart for details).     Patient with paresthesias to fingers toes and lips.  Possibly related to work injury with frequent vibrations.  But this is odd that he also has this in his toes and the lips.  There is been persisting for over 2 months.  Will do short course of prednisone.  Will start on trial of gabapentin.  Tramadol for severe pain.  Discussed following up with neurology.  Patient's blood pressure elevated today, has not been on blood pressure medicine in over a month.  He is asymptomatic at this time.  Will refill hydrochlorothiazide.  Discussed establishing care with PCP.  Discussed strict return precautions. Patient verbalized understanding and is agreeable with plan.   Final Clinical Impressions(s) / UC Diagnoses   Final diagnoses:  Paresthesia    ED Discharge Orders        Ordered    hydrochlorothiazide (HYDRODIURIL) 25 MG tablet  Daily     03/01/18 1351    predniSONE (DELTASONE) 50 MG tablet  Daily     03/01/18 1351    gabapentin (NEURONTIN) 100 MG capsule  3 times daily     03/01/18 1351    traMADol (ULTRAM) 50 MG tablet  Every 6 hours PRN     03/01/18 1353       Controlled Substance Prescriptions [place] Controlled Substance Registry consulted? Yes, I have consulted the [place] Controlled Substances Registry for  this patient, and feel the risk/benefit ratio today is favorable for proceeding with this prescription for a controlled substance.   Lew DawesWieters, Hallie C, [place]PA-C 03/01/18 [phone]
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
## 2019-07-20 ENCOUNTER — Encounter (HOSPITAL_COMMUNITY): Payer: Self-pay | Admitting: Emergency Medicine

## 2019-07-20 ENCOUNTER — Other Ambulatory Visit: Payer: Self-pay

## 2019-07-20 ENCOUNTER — Emergency Department (HOSPITAL_COMMUNITY)
Admission: EM | Admit: 2019-07-20 | Discharge: 2019-07-20 | Disposition: A | Discharge status: Home / Self Care | Payer: BLUE CROSS/BLUE SHIELD | Attending: Emergency Medicine | Admitting: Emergency Medicine

## 2019-07-20 ENCOUNTER — Emergency Department (HOSPITAL_COMMUNITY): Payer: BLUE CROSS/BLUE SHIELD

## 2019-07-20 DIAGNOSIS — M542 Cervicalgia: Secondary | ICD-10-CM | POA: Diagnosis not present

## 2019-07-20 DIAGNOSIS — G44319 Acute post-traumatic headache, not intractable: Secondary | ICD-10-CM

## 2019-07-20 DIAGNOSIS — Z79899 Other long term (current) drug therapy: Secondary | ICD-10-CM | POA: Diagnosis not present

## 2019-07-20 DIAGNOSIS — I10 Essential (primary) hypertension: Secondary | ICD-10-CM | POA: Diagnosis not present

## 2019-07-20 DIAGNOSIS — M545 Low back pain, unspecified: Secondary | ICD-10-CM

## 2019-07-20 DIAGNOSIS — R51 Headache: Secondary | ICD-10-CM | POA: Insufficient documentation

## 2019-07-20 DIAGNOSIS — M549 Dorsalgia, unspecified: Secondary | ICD-10-CM | POA: Diagnosis present

## 2019-07-20 DIAGNOSIS — F17210 Nicotine dependence, cigarettes, uncomplicated: Secondary | ICD-10-CM | POA: Diagnosis not present

## 2019-07-20 DIAGNOSIS — R030 Elevated blood-pressure reading, without diagnosis of hypertension: Secondary | ICD-10-CM

## 2019-07-20 MED ORDER — METHOCARBAMOL 500 MG PO TABS: 500.0000 mg | ORAL_TABLET | Freq: Two times a day (BID) | ORAL | 0 refills | Status: AC

## 2019-07-20 MED ORDER — HYDROCODONE-ACETAMINOPHEN 5-325 MG PO TABS
1.0000 | ORAL_TABLET | Freq: Once | ORAL | Status: AC
  Administered 2019-07-20: 1 via ORAL
  Filled 2019-07-20: qty 1

## 2019-07-20 MED ORDER — LIDOCAINE 5 % EX PTCH: 1.0000 | MEDICATED_PATCH | CUTANEOUS | 0 refills | Status: AC
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
## 2020-01-27 ENCOUNTER — Ambulatory Visit: Payer: Self-pay | Attending: Family

## 2020-01-27 DIAGNOSIS — Z23 Encounter for immunization: Secondary | ICD-10-CM | POA: Insufficient documentation

## 2020-01-27 NOTE — Progress Notes (Signed)
   Covid-19 Vaccination Clinic  Name:  Todd Friedman    MRN: [id number] DOB: Sep 15, 1961  01/27/2020  [name] was observed post Covid-19 immunization for 15 minutes without incidence. He was provided with Vaccine Information Sheet and instruction to access the V-Safe system.   [name] was instructed to call 911 with any severe reactions post vaccine: Marland Kitchen Difficulty breathing  . Swelling of your face and throat  . A fast heartbeat  . A bad rash all over your body  . Dizziness and weakness    Immunizations Administered    Name Date Dose VIS Date Route   Moderna COVID-19 Vaccine 01/27/2020 11:25 AM 0.5 mL 11/05/2019 Intramuscular   Manufacturer: Moderna   Lot: [id number]   NDC: 25672-091-98

## 2020-02-25 ENCOUNTER — Ambulatory Visit: Payer: Self-pay | Attending: Family

## 2020-02-25 DIAGNOSIS — Z23 Encounter for immunization: Secondary | ICD-10-CM

## 2020-02-25 NOTE — Progress Notes (Signed)
   Covid-19 Vaccination Clinic  Name:  Staci Carver    MRN: [id number] DOB: 1961-06-19  02/25/2020  Mr. Townsel was observed post Covid-19 immunization for 15 minutes without incident. He was provided with Vaccine Information Sheet and instruction to access the V-Safe system.   Mr. Dannemiller was instructed to call 911 with any severe reactions post vaccine: Marland Kitchen Difficulty breathing  . Swelling of face and throat  . A fast heartbeat  . A bad rash all over body  . Dizziness and weakness   Immunizations Administered    Name Date Dose VIS Date Route   Moderna COVID-19 Vaccine 02/25/2020  2:33 PM 0.5 mL 11/05/2019 Intramuscular   Manufacturer: Moderna   Lot: [id number]   NDC: 44975-300-51

## 2020-07-14 ENCOUNTER — Encounter (HOSPITAL_COMMUNITY): Payer: Self-pay | Admitting: Emergency Medicine

## 2020-07-14 ENCOUNTER — Emergency Department (HOSPITAL_COMMUNITY): Payer: Self-pay

## 2020-07-14 ENCOUNTER — Emergency Department (HOSPITAL_COMMUNITY)
Admission: EM | Admit: 2020-07-14 | Discharge: 2020-07-14 | Disposition: A | Discharge status: Other Acute Inpatient Hospital | Payer: Self-pay | Attending: Emergency Medicine | Admitting: Emergency Medicine

## 2020-07-14 DIAGNOSIS — R0989 Other specified symptoms and signs involving the circulatory and respiratory systems: Secondary | ICD-10-CM | POA: Insufficient documentation

## 2020-07-14 DIAGNOSIS — T5891XA Toxic effect of carbon monoxide from unspecified source, accidental (unintentional), initial encounter: Secondary | ICD-10-CM | POA: Insufficient documentation

## 2020-07-14 DIAGNOSIS — J705 Respiratory conditions due to smoke inhalation: Secondary | ICD-10-CM | POA: Insufficient documentation

## 2020-07-14 DIAGNOSIS — Z20822 Contact with and (suspected) exposure to covid-19: Secondary | ICD-10-CM | POA: Insufficient documentation

## 2020-07-14 DIAGNOSIS — T59811A Toxic effect of smoke, accidental (unintentional), initial encounter: Secondary | ICD-10-CM | POA: Insufficient documentation

## 2020-07-14 DIAGNOSIS — T1490XA Injury, unspecified, initial encounter: Secondary | ICD-10-CM

## 2020-07-14 LAB — I-STAT ARTERIAL BLOOD GAS, ED
Acid-base deficit: 7 mmol/L — ABNORMAL HIGH (ref 0.0–2.0)
Bicarbonate: 21.8 mmol/L (ref 20.0–28.0)
Calcium, Ion: 1.12 mmol/L — ABNORMAL LOW (ref 1.15–1.40)
HCT: 40 % (ref 39.0–52.0)
Hemoglobin: 13.6 g/dL (ref 13.0–17.0)
O2 Saturation: 100 %
Patient temperature: 98.6
Potassium: 3.7 mmol/L (ref 3.5–5.1)
Sodium: 135 mmol/L (ref 135–145)
TCO2: 24 mmol/L (ref 22–32)
pCO2 arterial: 58.1 mmHg — ABNORMAL HIGH (ref 32.0–48.0)
pH, Arterial: 7.183 — CL (ref 7.350–7.450)
pO2, Arterial: 374 mmHg — ABNORMAL HIGH (ref 83.0–108.0)

## 2020-07-14 LAB — COMPREHENSIVE METABOLIC PANEL
ALT: 76 U/L — ABNORMAL HIGH (ref 0–44)
AST: 132 U/L — ABNORMAL HIGH (ref 15–41)
Albumin: 4.1 g/dL (ref 3.5–5.0)
Alkaline Phosphatase: 105 U/L (ref 38–126)
Anion gap: 17 — ABNORMAL HIGH (ref 5–15)
BUN: <5 mg/dL — ABNORMAL LOW (ref 8–23)
CO2: 20 mmol/L — ABNORMAL LOW (ref 22–32)
Calcium: 9 mg/dL (ref 8.9–10.3)
Chloride: 97 mmol/L — ABNORMAL LOW (ref 98–111)
Creatinine, Ser: 1.07 mg/dL (ref 0.61–1.24)
GFR calc Af Amer: >60 mL/min (ref >60)
GFR calc non Af Amer: >60 mL/min (ref >60)
Glucose, Bld: 129 mg/dL — ABNORMAL HIGH (ref 70–99)
Potassium: 3.7 mmol/L (ref 3.5–5.1)
Sodium: 134 mmol/L — ABNORMAL LOW (ref 135–145)
Total Bilirubin: 0.3 mg/dL (ref 0.3–1.2)
Total Protein: 7.1 g/dL (ref 6.5–8.1)

## 2020-07-14 LAB — CBC
HCT: 41.3 % (ref 39.0–52.0)
Hemoglobin: 13.4 g/dL (ref 13.0–17.0)
MCH: 29.1 pg (ref 26.0–34.0)
MCHC: 32.4 g/dL (ref 30.0–36.0)
MCV: 89.8 fL (ref 80.0–100.0)
Platelets: 246 10*3/uL (ref 150–400)
RBC: 4.6 MIL/uL (ref 4.22–5.81)
RDW: 15.7 % — ABNORMAL HIGH (ref 11.5–15.5)
WBC: 12 10*3/uL — ABNORMAL HIGH (ref 4.0–10.5)
nRBC: 0 % (ref 0.0–0.2)

## 2020-07-14 LAB — PROTIME-INR
INR: 1 (ref 0.8–1.2)
Prothrombin Time: 13.1 seconds (ref 11.4–15.2)

## 2020-07-14 LAB — COOXEMETRY PANEL
Carboxyhemoglobin: 13.7 % (ref 0.5–1.5)
Methemoglobin: 1.2 % (ref 0.0–1.5)
O2 Saturation: 98.9 %
Total hemoglobin: 12.6 g/dL (ref 12.0–16.0)

## 2020-07-14 LAB — I-STAT CHEM 8, ED
BUN: 3 mg/dL — ABNORMAL LOW (ref 8–23)
Calcium, Ion: 1.03 mmol/L — ABNORMAL LOW (ref 1.15–1.40)
Chloride: 101 mmol/L (ref 98–111)
Creatinine, Ser: 1.2 mg/dL (ref 0.61–1.24)
Glucose, Bld: 125 mg/dL — ABNORMAL HIGH (ref 70–99)
HCT: 43 % (ref 39.0–52.0)
Hemoglobin: 14.6 g/dL (ref 13.0–17.0)
Potassium: 3.7 mmol/L (ref 3.5–5.1)
Sodium: 134 mmol/L — ABNORMAL LOW (ref 135–145)
TCO2: 20 mmol/L — ABNORMAL LOW (ref 22–32)

## 2020-07-14 LAB — URINALYSIS, ROUTINE W REFLEX MICROSCOPIC
Bacteria, UA: NONE SEEN
Bilirubin Urine: NEGATIVE
Glucose, UA: NEGATIVE mg/dL
Ketones, ur: NEGATIVE mg/dL
Leukocytes,Ua: NEGATIVE
Nitrite: NEGATIVE
Protein, ur: 100 mg/dL — AB
Specific Gravity, Urine: 1.01 (ref 1.005–1.030)
pH: 5 (ref 5.0–8.0)

## 2020-07-14 LAB — RAPID URINE DRUG SCREEN, HOSP PERFORMED
Amphetamines: NOT DETECTED
Barbiturates: NOT DETECTED
Benzodiazepines: NOT DETECTED
Cocaine: NOT DETECTED
Opiates: NOT DETECTED
Tetrahydrocannabinol: POSITIVE — AB

## 2020-07-14 LAB — SAMPLE TO BLOOD BANK

## 2020-07-14 LAB — LACTIC ACID, PLASMA: Lactic Acid, Venous: 5.8 mmol/L (ref 0.5–1.9)

## 2020-07-14 LAB — SARS CORONAVIRUS 2 BY RT PCR (HOSPITAL ORDER, PERFORMED IN ~~LOC~~ HOSPITAL LAB): SARS Coronavirus 2: NEGATIVE

## 2020-07-14 LAB — ETHANOL: Alcohol, Ethyl (B): 185 mg/dL — ABNORMAL HIGH (ref <10)

## 2020-07-14 MED ORDER — ROCURONIUM BROMIDE 50 MG/5ML IV SOLN
INTRAVENOUS | Status: AC | PRN
  Administered 2020-07-14: 80 mg via INTRAVENOUS

## 2020-07-14 MED ORDER — SODIUM CHLORIDE 0.9 % IV SOLN
INTRAVENOUS | Status: AC | PRN
  Administered 2020-07-14: 1000 mL via INTRAVENOUS

## 2020-07-14 MED ORDER — ETOMIDATE 2 MG/ML IV SOLN
INTRAVENOUS | Status: AC | PRN
  Administered 2020-07-14: 20 mg via INTRAVENOUS
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
## 2020-07-15 ENCOUNTER — Encounter (HOSPITAL_COMMUNITY): Payer: Self-pay | Admitting: Emergency Medicine

## 2020-07-17 MED FILL — Fentanyl Citrate Preservative Free (PF) Inj 100 MCG/2ML: INTRAMUSCULAR | Qty: 2 | Status: AC

## 2021-01-05 DEATH — deceased
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
